# Patient Record
Sex: Female | Born: 1970
Health system: Southern US, Community
[De-identification: ages and names within clinical notes are randomized; demographics above are authoritative.]

## PROBLEM LIST (undated history)

## (undated) DIAGNOSIS — C50919 Malignant neoplasm of unspecified site of unspecified female breast: Secondary | ICD-10-CM

## (undated) DIAGNOSIS — D649 Anemia, unspecified: Secondary | ICD-10-CM

## (undated) DIAGNOSIS — R011 Cardiac murmur, unspecified: Secondary | ICD-10-CM

## (undated) DIAGNOSIS — E119 Type 2 diabetes mellitus without complications: Secondary | ICD-10-CM

## (undated) DIAGNOSIS — N6019 Diffuse cystic mastopathy of unspecified breast: Secondary | ICD-10-CM

## (undated) DIAGNOSIS — G4730 Sleep apnea, unspecified: Secondary | ICD-10-CM

## (undated) HISTORY — DX: Malignant neoplasm of unspecified site of unspecified female breast: C50.919

## (undated) HISTORY — DX: Anemia, unspecified: D64.9

## (undated) HISTORY — DX: Sleep apnea, unspecified: G47.30

## (undated) HISTORY — PX: TUBAL LIGATION: SHX77

## (undated) HISTORY — PX: KNEE SURGERY: SHX244

## (undated) HISTORY — DX: Diffuse cystic mastopathy of unspecified breast: N60.19

## (undated) HISTORY — PX: WRIST SURGERY: SHX841

---

## 1993-03-18 HISTORY — PX: TONSILECTOMY/ADENOIDECTOMY WITH MYRINGOTOMY: SHX6125

## 1999-03-19 HISTORY — PX: OTHER SURGICAL HISTORY: SHX169

## 2009-03-18 HISTORY — PX: BREAST LUMPECTOMY: SHX2

## 2009-03-18 HISTORY — PX: ROTATOR CUFF REPAIR: SHX139

## 2011-06-05 ENCOUNTER — Emergency Department (HOSPITAL_COMMUNITY)
Admission: EM | Admit: 2011-06-05 | Discharge: 2011-06-05 | Discharge status: Against Medical Advice | Payer: BC Managed Care – PPO | Attending: Emergency Medicine | Admitting: Emergency Medicine

## 2011-06-05 DIAGNOSIS — R51 Headache: Secondary | ICD-10-CM | POA: Insufficient documentation

## 2011-09-18 ENCOUNTER — Other Ambulatory Visit: Payer: Self-pay | Admitting: Family Medicine

## 2011-09-18 DIAGNOSIS — Z1231 Encounter for screening mammogram for malignant neoplasm of breast: Secondary | ICD-10-CM

## 2011-10-02 ENCOUNTER — Ambulatory Visit: Payer: BC Managed Care – PPO

## 2011-10-04 ENCOUNTER — Ambulatory Visit
Admission: RE | Admit: 2011-10-04 | Discharge: 2011-10-04 | Disposition: A | Discharge status: Home / Self Care | Payer: BC Managed Care – PPO | Source: Ambulatory Visit | Attending: Family Medicine | Admitting: Family Medicine

## 2011-10-04 DIAGNOSIS — Z1231 Encounter for screening mammogram for malignant neoplasm of breast: Secondary | ICD-10-CM

## 2012-04-29 ENCOUNTER — Encounter: Payer: Self-pay | Admitting: Obstetrics and Gynecology

## 2012-05-07 ENCOUNTER — Encounter: Payer: Self-pay | Admitting: Obstetrics and Gynecology

## 2012-05-07 ENCOUNTER — Ambulatory Visit: Payer: BC Managed Care – PPO | Admitting: Obstetrics and Gynecology

## 2012-05-07 VITALS — BP 120/70 | HR 72 | Resp 16 | Ht 67.0 in | Wt 218.0 lb

## 2012-05-07 DIAGNOSIS — Z803 Family history of malignant neoplasm of breast: Secondary | ICD-10-CM

## 2012-05-07 DIAGNOSIS — N920 Excessive and frequent menstruation with regular cycle: Secondary | ICD-10-CM | POA: Insufficient documentation

## 2012-05-07 DIAGNOSIS — Z124 Encounter for screening for malignant neoplasm of cervix: Secondary | ICD-10-CM

## 2012-05-07 DIAGNOSIS — D219 Benign neoplasm of connective and other soft tissue, unspecified: Secondary | ICD-10-CM

## 2012-05-07 DIAGNOSIS — Z139 Encounter for screening, unspecified: Secondary | ICD-10-CM

## 2012-05-07 LAB — POCT URINALYSIS DIPSTICK
Bilirubin, UA: NEGATIVE
Blood, UA: NEGATIVE
Glucose, UA: NEGATIVE
Leukocytes, UA: NEGATIVE
Nitrite, UA: NEGATIVE
Urobilinogen, UA: NEGATIVE

## 2012-05-07 LAB — CBC
Hemoglobin: 13.8 g/dL (ref 12.0–15.0)
MCH: 28.8 pg (ref 26.0–34.0)
MCHC: 33.4 g/dL (ref 30.0–36.0)
Platelets: 347 10*3/uL (ref 150–400)
RBC: 4.8 MIL/uL (ref 3.87–5.11)

## 2012-05-07 LAB — TSH: TSH: 1.255 u[IU]/mL (ref 0.350–4.500)

## 2012-05-07 LAB — PROLACTIN: Prolactin: 14.4 ng/mL

## 2012-05-07 NOTE — Progress Notes (Signed)
Contraception BTL Last pap 2012 WNL Last Mammo 12/2011 WNL Last Colonoscopy None Last Dexa Scan None Primary MD Terrance Johns Abuse at Home: None  C/o very heavy cycles lasting 10days.  Changes pad q 1hr some days.  Filed Vitals:   05/07/12 0943  BP: 120/70  Pulse: 72  Resp: 16   ROS: noncontributory  Physical Examination: General appearance - alert, well appearing, and in no distress Neck - supple, no significant adenopathy Chest - clear to auscultation, no wheezes, rales or rhonchi, symmetric air entry Heart - normal rate and regular rhythm Abdomen - soft, nontender, nondistended, no masses or organomegaly Breasts - breasts appear normal, no suspicious masses, no skin or nipple changes or axillary nodes Pelvic - normal external genitalia, vulva, vagina, cervix, uterus and adnexa Back exam - no CVAT Extremities - no edema, redness or tenderness in the calves or thighs  A/P H/o 2 sisters with breast ca - no h/o brca 1 and 2 testing (pt has 11 sisters) - dx mammo at breast ctr - pt used to see a breast specialist in [place] H/o fibroids asymptomatic and small per pt and now with menorrhagia - sched u/s and embx next available TSH, cbc, prl, vit D

## 2012-05-08 LAB — VITAMIN D 25 HYDROXY (VIT D DEFICIENCY, FRACTURES): Vit D, 25-Hydroxy: 26 ng/mL — ABNORMAL LOW (ref 30–89)

## 2012-05-11 ENCOUNTER — Telehealth: Payer: Self-pay

## 2012-05-11 LAB — PAP IG W/ RFLX HPV ASCU

## 2012-05-11 NOTE — Telephone Encounter (Signed)
Called the [facility], as I got a message from them stating that a Dx mammogram was ordered. Pt has strong family hx of breast cancer. 2 of her sisters have breast cancer. They will not do this, as as screening mammogram was done last year in July. They will not do more than one per yr with previously normal result. They do not want to over-radiate the breast. They did say that I should tell the pt to ask for the 3-D mammo option in July, as that will give her more peace of mind. I called the pt to tell her all of this and she said she is going to follow back up with her breast specialist in [place] to see what his/her recommendations are. Melody Comas A

## 2012-05-18 ENCOUNTER — Telehealth: Payer: Self-pay

## 2012-05-18 ENCOUNTER — Other Ambulatory Visit: Payer: Self-pay

## 2012-05-18 DIAGNOSIS — E559 Vitamin D deficiency, unspecified: Secondary | ICD-10-CM

## 2012-05-18 NOTE — Telephone Encounter (Signed)
Spoke to pt to let her know Vit D level is low. Vitamin D protocol done. Recall entered and future order done. Melody Comas A

## 2012-05-25 ENCOUNTER — Telehealth: Payer: Self-pay | Admitting: Obstetrics and Gynecology

## 2012-05-25 NOTE — Telephone Encounter (Signed)
Ar pt 

## 2012-05-25 NOTE — Telephone Encounter (Signed)
Recalled Vit D protocol to [facility] in [place] on [address]. It was called previously, but I guess message did not transmit. Melody Comas A

## 2012-06-01 ENCOUNTER — Telehealth: Payer: Self-pay | Admitting: Obstetrics and Gynecology

## 2012-06-01 NOTE — Telephone Encounter (Signed)
Spoke with pt rgd msg pt had questions rgd how rx for vitd dispensed advised pt to call insurance company pt voice understanding

## 2013-04-01 ENCOUNTER — Other Ambulatory Visit: Payer: Self-pay | Admitting: Obstetrics and Gynecology

## 2013-04-01 DIAGNOSIS — N63 Unspecified lump in unspecified breast: Secondary | ICD-10-CM

## 2013-04-12 ENCOUNTER — Ambulatory Visit
Admission: RE | Admit: 2013-04-12 | Discharge: 2013-04-12 | Disposition: A | Discharge status: Home / Self Care | Payer: BC Managed Care – PPO | Source: Ambulatory Visit | Attending: Obstetrics and Gynecology | Admitting: Obstetrics and Gynecology

## 2013-04-12 ENCOUNTER — Other Ambulatory Visit: Payer: Self-pay | Admitting: Obstetrics and Gynecology

## 2013-04-12 DIAGNOSIS — N63 Unspecified lump in unspecified breast: Secondary | ICD-10-CM

## 2013-12-19 DIAGNOSIS — Z803 Family history of malignant neoplasm of breast: Secondary | ICD-10-CM | POA: Insufficient documentation

## 2014-01-17 ENCOUNTER — Encounter: Payer: Self-pay | Admitting: Obstetrics and Gynecology

## 2015-09-28 DIAGNOSIS — Z8639 Personal history of other endocrine, nutritional and metabolic disease: Secondary | ICD-10-CM | POA: Insufficient documentation

## 2015-09-28 DIAGNOSIS — Z8669 Personal history of other diseases of the nervous system and sense organs: Secondary | ICD-10-CM | POA: Insufficient documentation

## 2015-09-28 DIAGNOSIS — N898 Other specified noninflammatory disorders of vagina: Secondary | ICD-10-CM | POA: Insufficient documentation

## 2015-09-28 DIAGNOSIS — Z862 Personal history of diseases of the blood and blood-forming organs and certain disorders involving the immune mechanism: Secondary | ICD-10-CM | POA: Insufficient documentation

## 2015-09-28 DIAGNOSIS — R6882 Decreased libido: Secondary | ICD-10-CM | POA: Insufficient documentation

## 2015-09-28 DIAGNOSIS — N6011 Diffuse cystic mastopathy of right breast: Secondary | ICD-10-CM | POA: Insufficient documentation

## 2015-09-28 DIAGNOSIS — Z8619 Personal history of other infectious and parasitic diseases: Secondary | ICD-10-CM | POA: Insufficient documentation

## 2015-12-19 ENCOUNTER — Other Ambulatory Visit: Payer: Self-pay | Admitting: Chiropractic Medicine

## 2015-12-19 ENCOUNTER — Ambulatory Visit
Admission: RE | Admit: 2015-12-19 | Discharge: 2015-12-19 | Disposition: A | Discharge status: Home / Self Care | Payer: BLUE CROSS/BLUE SHIELD | Source: Ambulatory Visit | Attending: Chiropractic Medicine | Admitting: Chiropractic Medicine

## 2015-12-19 DIAGNOSIS — M5408 Panniculitis affecting regions of neck and back, sacral and sacrococcygeal region: Secondary | ICD-10-CM

## 2015-12-19 DIAGNOSIS — M609 Myositis, unspecified: Secondary | ICD-10-CM

## 2016-03-22 ENCOUNTER — Other Ambulatory Visit: Payer: Self-pay

## 2016-03-22 ENCOUNTER — Other Ambulatory Visit: Payer: Self-pay | Admitting: Obstetrics and Gynecology

## 2016-03-22 DIAGNOSIS — R9389 Abnormal findings on diagnostic imaging of other specified body structures: Secondary | ICD-10-CM

## 2016-03-22 DIAGNOSIS — N63 Unspecified lump in unspecified breast: Secondary | ICD-10-CM

## 2016-03-25 ENCOUNTER — Other Ambulatory Visit: Payer: BLUE CROSS/BLUE SHIELD

## 2016-03-28 ENCOUNTER — Ambulatory Visit
Admission: RE | Admit: 2016-03-28 | Discharge: 2016-03-28 | Disposition: A | Discharge status: Home / Self Care | Payer: BLUE CROSS/BLUE SHIELD | Source: Ambulatory Visit | Attending: Obstetrics and Gynecology | Admitting: Obstetrics and Gynecology

## 2016-03-28 DIAGNOSIS — R9389 Abnormal findings on diagnostic imaging of other specified body structures: Secondary | ICD-10-CM

## 2016-03-28 DIAGNOSIS — N63 Unspecified lump in unspecified breast: Secondary | ICD-10-CM

## 2016-03-28 MED ORDER — GADOBENATE DIMEGLUMINE 529 MG/ML IV SOLN
20.0000 mL | Freq: Once | INTRAVENOUS | Status: AC | PRN
  Administered 2016-03-28: 20 mL via INTRAVENOUS

## 2016-04-17 DIAGNOSIS — R928 Other abnormal and inconclusive findings on diagnostic imaging of breast: Secondary | ICD-10-CM | POA: Insufficient documentation

## 2016-04-24 ENCOUNTER — Ambulatory Visit
Admission: RE | Admit: 2016-04-24 | Discharge: 2016-04-24 | Disposition: A | Discharge status: Home / Self Care | Payer: BLUE CROSS/BLUE SHIELD | Source: Ambulatory Visit | Attending: Obstetrics and Gynecology | Admitting: Obstetrics and Gynecology

## 2016-04-24 ENCOUNTER — Other Ambulatory Visit: Payer: Self-pay | Admitting: Obstetrics and Gynecology

## 2016-04-24 DIAGNOSIS — Z09 Encounter for follow-up examination after completed treatment for conditions other than malignant neoplasm: Secondary | ICD-10-CM

## 2016-05-19 DIAGNOSIS — Z1589 Genetic susceptibility to other disease: Secondary | ICD-10-CM | POA: Insufficient documentation

## 2017-11-12 DIAGNOSIS — Z800 Family history of malignant neoplasm of digestive organs: Secondary | ICD-10-CM | POA: Insufficient documentation

## 2017-12-14 ENCOUNTER — Encounter (HOSPITAL_BASED_OUTPATIENT_CLINIC_OR_DEPARTMENT_OTHER): Payer: Self-pay | Admitting: *Deleted

## 2017-12-14 ENCOUNTER — Emergency Department (HOSPITAL_BASED_OUTPATIENT_CLINIC_OR_DEPARTMENT_OTHER)
Admission: EM | Admit: 2017-12-14 | Discharge: 2017-12-15 | Disposition: A | Discharge status: Home / Self Care | Payer: BLUE CROSS/BLUE SHIELD | Attending: Emergency Medicine | Admitting: Emergency Medicine

## 2017-12-14 ENCOUNTER — Other Ambulatory Visit: Payer: Self-pay

## 2017-12-14 DIAGNOSIS — G43009 Migraine without aura, not intractable, without status migrainosus: Secondary | ICD-10-CM | POA: Diagnosis not present

## 2017-12-14 DIAGNOSIS — Z79899 Other long term (current) drug therapy: Secondary | ICD-10-CM | POA: Diagnosis not present

## 2017-12-14 DIAGNOSIS — R51 Headache: Secondary | ICD-10-CM | POA: Diagnosis present

## 2017-12-14 MED ORDER — DIPHENHYDRAMINE HCL 50 MG/ML IJ SOLN
25.0000 mg | Freq: Once | INTRAMUSCULAR | Status: AC
Start: 2017-12-14 — End: 2017-12-15
  Administered 2017-12-15: 25 mg via INTRAVENOUS
  Filled 2017-12-14: qty 1

## 2017-12-14 MED ORDER — PROCHLORPERAZINE EDISYLATE 10 MG/2ML IJ SOLN
10.0000 mg | Freq: Once | INTRAMUSCULAR | Status: AC
  Administered 2017-12-15: 10 mg via INTRAVENOUS
  Filled 2017-12-14: qty 2

## 2017-12-14 MED ORDER — KETOROLAC TROMETHAMINE 30 MG/ML IJ SOLN
30.0000 mg | Freq: Once | INTRAMUSCULAR | Status: AC
Start: 2017-12-14 — End: 2017-12-15
  Administered 2017-12-15: 30 mg via INTRAVENOUS
  Filled 2017-12-14: qty 1

## 2017-12-14 NOTE — ED Triage Notes (Signed)
Pt reports she works for [employer]. She began having headaches and saw a doctor there who dx her with cluster headaches vs tension headaches. She has been using the prescribed medications (methocarbamol, hydroxyzine, and eletriptan) without relief

## 2017-12-15 NOTE — Discharge Instructions (Addendum)
You were seen today for headache.  It is unclear whether this is recurrent migraine versus tension headaches.  You may need to see a neurologist as an outpatient if you continue to have headaches.  Make sure to stay well-hydrated and get good sleep.

## 2017-12-15 NOTE — ED Provider Notes (Signed)
[facility] EMERGENCY DEPART[facility] Provider Note   CSN: [id number] Arrival date & time: 12/14/17  2226     History   Chief Complaint Chief Complaint  Patient presents with  . Headache    HPI Michelle Moss is a 47 y.o. female.  HPI  This is a 47 year old female with a remote history of migraines who presents with headache.  Patient reports 2-week history of mostly nightly headaches.  She states that during the day she is usually comfortable but at night she can get a dull left-sided headache with intermittent sharp pains.  She denies any vision changes, photosensitivity, nausea, vomiting.  Pain radiates into her left neck and left arm.  She has not noted any rashes.  She saw her doctor at work who prescribed her with muscle relaxer, triptan, anti-inflammatory medication.  She states that these are not helping.  Tonight pain was more acute in onset and worse.  It is currently 8 out of 10.  She denies any fevers, neck stiffness.  She does have a remote history of migraines.  She denies any upper respiratory symptoms.  She did take 1 dose of Sudafed with no relief thinking it may be related to sinusitis.  Past Medical History:  Diagnosis Date  . Fibrocystic breast     Patient Active Problem List   Diagnosis Date Noted  . Menorrhagia 05/07/2012  . Fibroids 05/07/2012    Past Surgical History:  Procedure Laterality Date  . BREAST LUMPECTOMY  2011  . BTL  2001  . ROTATOR CUFF REPAIR  2011  . TONSILECTOMY/ADENOIDECTOMY WITH MYRINGOTOMY  1995  . TUBAL LIGATION    . WRIST SURGERY       OB History    Gravida  2   Para  2   Term      Preterm      AB      Living        SAB      TAB      Ectopic      Multiple      Live Births               Home Medications    Prior to Admission medications   Medication Sig Start Date End Date Taking? Authorizing Provider  eletriptan (RELPAX) 40 MG tablet Take 40 mg by mouth as needed for migraine or headache. May  repeat in 2 hours if headache persists or recurs.   Yes [provider]  HYDROXYZINE PAMOATE PO Take 50 mg by mouth as needed.   Yes [provider]  methocarbamol (ROBAXIN) 500 MG tablet Take 500 mg by mouth as needed for muscle spasms.   Yes [provider]  Omega-3 Fatty Acids (FISH OIL PO) Take by mouth.   Yes [provider]    Family History Family History  Problem Relation Age of Onset  . Diabetes Father   . Heart disease Father   . Arthritis Mother     Social History Social History   Tobacco Use  . Smoking status: Never Smoker  . Smokeless tobacco: Never Used  Substance Use Topics  . Alcohol use: Not Currently    Comment: social  . Drug use: No     Allergies   Patient has no known allergies.   Review of Systems Review of Systems  Constitutional: Negative for fever.  Eyes: Negative for photophobia.  Respiratory: Negative for shortness of breath.   Cardiovascular: Negative for chest pain.  Gastrointestinal: Negative  for nausea and vomiting.  Neurological: Positive for headaches. Negative for dizziness, speech difficulty, weakness and numbness.  All other systems reviewed and are negative.    Physical Exam Updated Vital Signs BP (!) 145/99 (BP Location: Left Arm)   Pulse 71   Temp 99 F (37.2 C) (Oral)   Resp 20   Ht 1.702 m (5\' 7" )   Wt 102.5 kg   SpO2 98%   BMI 35.40 kg/m   Physical Exam  Constitutional: She is oriented to person, place, and time. She appears well-developed and well-nourished. No distress.  HENT:  Head: Normocephalic and atraumatic.  Cerumen impaction  Eyes: Pupils are equal, round, and reactive to light. EOM are normal.  Neck: Normal range of motion. Neck supple.  No meningismus  Cardiovascular: Normal rate, regular rhythm and normal heart sounds.  Pulmonary/Chest: Effort normal. No respiratory distress. She has no wheezes.  Abdominal: Soft. Bowel sounds are normal.  Neurological: She is  alert and oriented to person, place, and time.  Fluent speech, cranial nerves II through XII intact, 5 out of 5 strength in all 4 extremities, no dysmetria to finger-nose-finger  Skin: Skin is warm and dry. No rash noted.  Psychiatric: She has a normal mood and affect.  Nursing note and vitals reviewed.    ED Treatments / Results  Labs (all labs ordered are listed, but only abnormal results are displayed) Labs Reviewed - No data to display  EKG None  Radiology No results found.  Procedures Procedures (including critical care time)  Medications Ordered in ED Medications  prochlorperazine (COMPAZINE) injection 10 mg (10 mg Intravenous Given 12/15/17 0016)  diphenhydrAMINE (BENADRYL) injection 25 mg (25 mg Intravenous Given 12/15/17 0007)  ketorolac (TORADOL) 30 MG/ML injection 30 mg (30 mg Intravenous Given 12/15/17 0012)     Initial Impression / Assessment and Plan / ED Course  I have reviewed the triage vital signs and the nursing notes.  Pertinent labs & imaging results that were available during my care of the patient were reviewed by me and considered in my medical decision making (see chart for details).     Patient presents with headache.  She is overall nontoxic-appearing vital signs are reassuring.  No red flags.  Doubt infectious etiology or subarachnoid hemorrhage.  She does have a history of migraines.  However, there are some symptoms to suggest tension headache.  Her history and exam are reassuring.  I do not feel imaging is indicated at this time.  I discussed this with the patient.  We will focus on getting her feeling better.  If she has continued recurrent headaches, she will need to follow-up with neurology as an outpatient.  Patient was given a migraine cocktail.  She had resolution of symptoms and states she is ready to go home.  Final Clinical Impressions(s) / ED Diagnoses   Final diagnoses:  Migraine without aura and without status migrainosus, not  intractable    ED Discharge Orders    None       Monterius Rolf, Barbette Hair, MD 12/15/17 [phone]

## 2018-02-05 ENCOUNTER — Ambulatory Visit: Payer: BLUE CROSS/BLUE SHIELD | Attending: Family Medicine | Admitting: Physical Therapy

## 2018-02-05 ENCOUNTER — Encounter: Payer: Self-pay | Admitting: Physical Therapy

## 2018-02-05 DIAGNOSIS — M25561 Pain in right knee: Secondary | ICD-10-CM | POA: Insufficient documentation

## 2018-02-05 NOTE — Therapy (Signed)
[facility] [address]  [place], [place], [address] Phone: [phone]   Fax:  [phone]  Physical Therapy Evaluation  Patient Details  Name: Michelle Moss MRN: [id number] Date of Birth: 05/31/1970 Referring Provider (PT): Bronson Curb MD   Encounter Date: 02/05/2018  PT End of Session - 02/05/18 1449    Visit Number  1    Number of Visits  12    Date for PT Re-Evaluation  03/19/18    Authorization Type  BCBS VL 60    PT Start Time  1315    PT Stop Time  1410    PT Time Calculation (min)  55 min    Activity Tolerance  Patient tolerated treatment well    Behavior During Therapy  [facility] for tasks assessed/performed       Past Medical History:  Diagnosis Date  . Fibrocystic breast     Past Surgical History:  Procedure Laterality Date  . BREAST LUMPECTOMY  2011  . BTL  2001  . ROTATOR CUFF REPAIR  2011  . TONSILECTOMY/ADENOIDECTOMY WITH MYRINGOTOMY  1995  . TUBAL LIGATION    . WRIST SURGERY      There were no vitals filed for this visit.   Subjective Assessment - 02/05/18 1318    Subjective  Pt relays 2-3 months ago started having some knee pain, she saw MD and prescribed some meds which helped some. She has been trying to run for exercise and training for 5K but her knee has been limiting her with this. She saw Ortho who took x-rays showing some mild OA and some patella femoral syndrome. She saws MD prescribed knee brace(she did not bring this but sounds like cophat strap) this has has helped some. She does have a catching sensation in her knee with walking at times, increased pain and difficulty with squats or lunges, stairs, wearing heels.     Pertinent History  no significant PMH for knee or co-morbidities, Rt RCR 2011.    Limitations  Sitting;Lifting;Standing;Walking;House hold activities    How long can you stand comfortably?  less than one hour    Diagnostic tests  x-rays show mild OA, and  patellafemoral syndrome    Currently in Pain?  Yes    Pain Score  7     Pain Location  Knee    Pain Orientation  Right    Pain Descriptors / Indicators  Aching;Sharp    Pain Type  Acute pain   subacute   Pain Radiating Towards  none    Pain Onset  More than a month ago    Pain Frequency  Intermittent    Pain Relieving Factors  ice, sometimes meds         [facility] PT Assessment - 02/05/18 0001      Assessment   Medical Diagnosis  Rt knee pain, patellofemoral syndrome and mild OA    Referring Provider (PT)  Bronson Curb MD    Onset Date/Surgical Date  --   3 month onset of pain   Next MD Visit  --   not scheduled   Prior Therapy  PT for back and shoulder in past      Precautions   Precautions  None      Restrictions   Weight Bearing Restrictions  No      Balance Screen   Has the patient fallen in the past 6 months  No      Home Environment  Living Environment  Private residence      Prior Function   Level of [place]   currently still independent   Vocation  Full time employment    Vocation Requirements  office work, sitting mostly    Leisure  exercise, training for [employer]      Observation/Other Assessments   Focus on Therapeutic Outcomes (FOTO)   57% limited      Sensation   Light Touch  Appears Intact      ROM / Strength   AROM / PROM / Strength  AROM;Strength      AROM   Overall AROM Comments  WFL knee ROM      Strength   Overall Strength Comments  Rt knee strength 5/5, Rt hip strength 5/5 except 4/5 adduction strength       Flexibility   Soft Tissue Assessment /Muscle Length  --   mild tightness in Rt IT band, H.S, quads     Palpation   Patella mobility  mild lateral tracking      Special Tests   Other special tests  neg ligamentous tests, +meniscal tests for pain but no catching or locking reported (mcmurrays,thessly, edges)      Ambulation/Gait   Gait Comments  WFL                Objective measurements completed on  examination: See above findings.      [facility] Adult PT Treatment/Exercise - 02/05/18 0001      Modalities   Modalities  Cryotherapy;Electrical Stimulation      Cryotherapy   Number Minutes Cryotherapy  10 Minutes    Cryotherapy Location  Knee    Type of Cryotherapy  Ice pack      Electrical Stimulation   Electrical Stimulation Location  Rt knee    Electrical Stimulation Action  IFC    Electrical Stimulation Parameters  tolerance    Electrical Stimulation Goals  Pain             PT Education - 02/05/18 1438    Education Details  HEP, POC, avoid anything at gym or running that causes more than 3/10 pain, focus on gentle eccentric strengthening and adduction strength, running mechanics of striking on mid foot and shorter strides with increased cadence    Person(s) Educated  Patient    Methods  Explanation;Demonstration;Verbal cues;Handout    Comprehension  Verbalized understanding;Need further instruction          PT Long Term Goals - 02/05/18 1456      PT LONG TERM GOAL #1   Title  Pt will be I and compliant with HEP. 6 weeks 03/19/17    Status  New      PT LONG TERM GOAL #2   Title  Pt will be able to perform her usual ADL's and gym workout routine and walk up/down one flight of stairs with less than 2/10 overall pain. 6 weeks 03/19/17    Status  New      PT LONG TERM GOAL #3   Title  Pt will be improve FOTO to less than 35% limitation. 6 weeks 03/19/17    Status  New      PT LONG TERM GOAL #4   Title  Pt will be able to return to jogging with less than 2/10 pain. 6 weeks 03/19/17    Status  New             Plan - 02/05/18 1451    Clinical Impression  Statement  Pt presents with Rt knee pain, patellofemoral syndrome and mild OA. She has some lateral patella tracking, increased tightness in IT band, quads and H.S on Rt, decreased hip add strength, increased pain with stairs, squatting, prolonged walking, and running. Meniscal pathology could not be ruled out today  and she also has signs of quad tendonitis. She will benefit from skilled PT to address her deficits.     Clinical Presentation  Evolving    Clinical Presentation due to:  worsening pain over last 3 months    Clinical Decision Making  Moderate    Rehab Potential  Good    Clinical Impairments Affecting Rehab Potential  pt performing high intensity exercise and running at gym, she was educted to back off of this while her knee heals    PT Frequency  2x / week    PT Duration  6 weeks    PT Treatment/Interventions  Cryotherapy;Electrical Stimulation;Iontophoresis 4mg /ml Dexamethasone;Moist Heat;Ultrasound;Stair training;Gait training;Therapeutic activities;Therapeutic exercise;Neuromuscular re-education;Manual techniques;Passive range of motion;Dry needling;Taping;Joint Manipulations    PT Next Visit Plan  review HEP, lateral tracking correction, eccentrics, consider taping and modalties       Patient will benefit from skilled therapeutic intervention in order to improve the following deficits and impairments:  Decreased activity tolerance, Decreased endurance, Decreased range of motion, Decreased strength, Hypomobility, Impaired flexibility, Pain, Postural dysfunction  Visit Diagnosis: Acute pain of right knee     Problem List Patient Active Problem List   Diagnosis Date Noted  . Menorrhagia 05/07/2012  . Fibroids 05/07/2012    Silvestre Mesi 02/05/2018, 3:11 PM  [facility] [address]  [place], [place], [address] Phone: [phone]   Fax:  [phone]  Name: Quynn Vilchis MRN: [id number] Date of Birth: 01/24/1971

## 2018-02-05 NOTE — Patient Instructions (Signed)
Access Code: [email or web address]  URL: [email or web address]  Date: 02/05/2018  Prepared by: Elsie Ra   Exercises  Seated Hamstring Stretch - 3 sets - 30 hold - 2x daily - 6x weekly  Supine ITB Stretch with Strap - 3 sets - 30 hold - 2x daily - 6x weekly  Forward Step Up - 10 reps - 3 sets - 5 hold - 1x daily - 7x weekly  Wall Squat with Ball between Knees - 10 reps - 3 sets - 2x daily - 6x weekly  Sit to Stand with Diona Foley Between Knees - 10 reps - 3 sets - 2x daily - 6x weekly  Sidelying Hip Adduction - 10 reps - 2-3 sets - 2x daily - 6x weekly

## 2018-02-11 ENCOUNTER — Encounter: Payer: Self-pay | Admitting: Physical Therapy

## 2018-02-11 ENCOUNTER — Ambulatory Visit: Payer: BLUE CROSS/BLUE SHIELD | Admitting: Physical Therapy

## 2018-02-11 DIAGNOSIS — M25561 Pain in right knee: Secondary | ICD-10-CM

## 2018-02-11 NOTE — Therapy (Addendum)
[facility] [address]  [place], [place], [address] Phone: [phone]   Fax:  [phone]  Physical Therapy Treatment  Patient Details  Name: Michelle Moss MRN: [id number] Date of Birth: Dec 14, 1970 Referring Provider (PT): Bronson Curb MD   Encounter Date: 02/11/2018  PT End of Session - 02/11/18 1145    Visit Number  2    Number of Visits  12    Date for PT Re-Evaluation  03/19/18    Authorization Type  BCBS VL 60    PT Start Time  3151    PT Stop Time  1243    PT Time Calculation (min)  58 min    Activity Tolerance  Patient tolerated treatment well    Behavior During Therapy  [facility] for tasks assessed/performed       Past Medical History:  Diagnosis Date  . Fibrocystic breast     Past Surgical History:  Procedure Laterality Date  . BREAST LUMPECTOMY  2011  . BTL  2001  . ROTATOR CUFF REPAIR  2011  . TONSILECTOMY/ADENOIDECTOMY WITH MYRINGOTOMY  1995  . TUBAL LIGATION    . WRIST SURGERY      There were no vitals filed for this visit.  Subjective Assessment - 02/11/18 1149    Subjective  Pt requesting help with ensuring proper use of knee Cho-pat brace/strap as well as clarification of HEP.    Pertinent History  no significant PMH for knee or co-morbidities, Rt RCR 2011.    Limitations  Sitting;Lifting;Standing;Walking;House hold activities    How long can you stand comfortably?  less than one hour    Diagnostic tests  x-rays show mild OA, and patellafemoral syndrome    Currently in Pain?  Yes    Pain Score  6    5-6/10   Pain Location  Knee    Pain Orientation  Right    Pain Descriptors / Indicators  Tightness   "swollen"   Pain Type  Acute pain   subacute   Pain Frequency  Constant   noting pain even at rest today                      [facility] Adult PT Treatment/Exercise - 02/11/18 1145      Exercises   Exercises  Knee/Hip      Knee/Hip Exercises: Stretches   Active  Hamstring Stretch  Right;30 seconds;2 reps    Active Hamstring Stretch Limitations  seated with hip hinge    ITB Stretch  Right;30 seconds;2 reps    ITB Stretch Limitations  supine with strap      Knee/Hip Exercises: Aerobic   Recumbent Bike  L2 x 6 min      Knee/Hip Exercises: Standing   Forward Step Up  Right;20 reps;Step Height: 6";Hand Hold: 0    Forward Step Up Limitations  cues for quad activation for full knee extension     Wall Squat  10 reps;3 seconds;2 sets    Wall Squat Limitations  + ball squeeze (2nd set with blue weighted med ball)      Knee/Hip Exercises: Supine   Bridges  Both;15 reps;Strengthening   5" hold   Bridges Limitations  + hip ABD isometric with green TB      Knee/Hip Exercises: Sidelying   Hip ADduction  Right;AROM;15 reps    Clams  R clam with green TB x15      Modalities   Modalities  Vasopneumatic  Vasopneumatic   Number Minutes Vasopneumatic   10 minutes    Vasopnuematic Location   Knee   Rt   Vasopneumatic Pressure  High    Vasopneumatic Temperature   coldest      Manual Therapy   Manual Therapy  Taping    Kinesiotex  Create Space      Kinesiotix   Create Space  R knee - chondromalacia patella pattern                  PT Long Term Goals - 02/11/18 1153      PT LONG TERM GOAL #1   Title  Pt will be I and compliant with HEP. 6 weeks 03/19/17    Status  On-going      PT LONG TERM GOAL #2   Title  Pt will be able to perform her usual ADL's and gym workout routine and walk up/down one flight of stairs with less than 2/10 overall pain. 6 weeks 03/19/17    Status  On-going      PT LONG TERM GOAL #3   Title  Pt will be improve FOTO to less than 35% limitation. 6 weeks 03/19/17    Status  On-going      PT LONG TERM GOAL #4   Title  Pt will be able to return to jogging with less than 2/10 pain. 6 weeks 03/19/17    Status  On-going            Plan - 02/11/18 1153    Clinical Impression Statement  Clarified rationale for  ITB stretch and verified proper placement of Cho-pat strap for use when running at pt request. Reviewed HEP clarifying proper pacing and hold times for stretches and exercises as well as cues to only take wall squats to depth of onset of pain and avoid pushing past pain. Initiated trial of kinesiotaping for R knee patellofemoral pain and will assess response on next visit. Treatment concluded with vasopneumatic compression due to c/o increased pain and swelling since running last night.     Rehab Potential  Good    Clinical Impairments Affecting Rehab Potential  pt performing high intensity exercise and running at gym, she was educted to back off of this while her knee heals    PT Frequency  2x / week    PT Duration  6 weeks    PT Treatment/Interventions  Cryotherapy;Electrical Stimulation;Iontophoresis 4mg /ml Dexamethasone;Moist Heat;Ultrasound;Stair training;Gait training;Therapeutic activities;Therapeutic exercise;Neuromuscular re-education;Manual techniques;Passive range of motion;Dry needling;Taping;Joint Manipulations    PT Next Visit Plan  assess response to taping, lateral tracking correction, eccentrics, proximal stability, consider further taping and modalties as benefit noted    Consulted and Agree with Plan of Care  Patient       Patient will benefit from skilled therapeutic intervention in order to improve the following deficits and impairments:  Decreased activity tolerance, Decreased endurance, Decreased range of motion, Decreased strength, Hypomobility, Impaired flexibility, Pain, Postural dysfunction  Visit Diagnosis: Acute pain of right knee     Problem List Patient Active Problem List   Diagnosis Date Noted  . Menorrhagia 05/07/2012  . Fibroids 05/07/2012    Percival Spanish, PT, MPT 02/11/2018, 1:49 PM  [facility] [address]  [place], [place], [address] Phone: [phone]   Fax:  [phone]  Name:  Michelle Moss MRN: [id number] Date of Birth: Aug 05, 1970

## 2018-02-17 ENCOUNTER — Encounter: Payer: BLUE CROSS/BLUE SHIELD | Admitting: Physical Therapy

## 2018-02-18 ENCOUNTER — Ambulatory Visit: Payer: BLUE CROSS/BLUE SHIELD | Attending: Family Medicine | Admitting: Physical Therapy

## 2018-02-18 DIAGNOSIS — M25561 Pain in right knee: Secondary | ICD-10-CM | POA: Diagnosis not present

## 2018-02-18 NOTE — Therapy (Signed)
[facility] [address]  [place], [place], [address] Phone: [phone]   Fax:  [phone]  Physical Therapy Treatment  Patient Details  Name: Michelle Moss MRN: [id number] Date of Birth: [birth year] Referring Provider (PT): Bronson Curb MD   Encounter Date: 02/18/2018  PT End of Session - 02/18/18 1615    Visit Number  3    Number of Visits  12    Date for PT Re-Evaluation  03/19/18    Authorization Type  BCBS VL 60    PT Start Time  1530    PT Stop Time  1623    PT Time Calculation (min)  53 min    Activity Tolerance  Patient tolerated treatment well    Behavior During Therapy  [facility] for tasks assessed/performed       Past Medical History:  Diagnosis Date  . Fibrocystic breast     Past Surgical History:  Procedure Laterality Date  . BREAST LUMPECTOMY  2011  . BTL  2001  . ROTATOR CUFF REPAIR  2011  . TONSILECTOMY/ADENOIDECTOMY WITH MYRINGOTOMY  1995  . TUBAL LIGATION    . WRIST SURGERY      There were no vitals filed for this visit.  Subjective Assessment - 02/18/18 1544    Subjective  Pt relays the tape really helped her knee.    How long can you stand comfortably?  less than one hour    Diagnostic tests  x-rays show mild OA, and patellafemoral syndrome    Currently in Pain?  Yes    Pain Score  4     Pain Location  Knee    Pain Orientation  Right    Pain Descriptors / Indicators  Tightness    Pain Type  Acute pain                       [facility] Adult PT Treatment/Exercise - 02/18/18 0001      Exercises   Exercises  Knee/Hip      Knee/Hip Exercises: Stretches   Quad Stretch  Right;3 reps;30 seconds    Quad Stretch Limitations  prone with strap    ITB Stretch  Right;30 seconds;2 reps    ITB Stretch Limitations  supine with strap      Knee/Hip Exercises: Aerobic   Tread Mill  intervals, 1st 3 min walking, then one min jog 5.0, then walk 1 min then jog 30 sec then walk one  min, 7 min total      Knee/Hip Exercises: Standing   Forward Lunges Limitations  10 reps bilat with TRX support focus on slow eccentric control.       Knee/Hip Exercises: Seated   Long Arc Quad  Right;2 sets;10 reps    Long Arc Quad Weight  3 lbs.    Long [employer] Limitations  ball sq    Sit to [employer]  15 reps   eccentric with ball sq from low mat and airex     Knee/Hip Exercises: Supine   Bridges  Both;10 reps    Bridges Limitations  + ball squeeze and alt knee ext      Knee/Hip Exercises: Sidelying   Hip ADduction  AROM;Right;2 sets;10 reps    Clams  Rt clam with green 2X10      Modalities   Modalities  Vasopneumatic      Vasopneumatic   Number Minutes Vasopneumatic   10 minutes    Vasopnuematic Location  Knee    Vasopneumatic Pressure  High    Vasopneumatic Temperature   coldest      Manual Therapy   Manual Therapy  Taping      [employer]   Create Space  R knee - chondromalacia patella pattern                  PT Long Term Goals - 02/11/18 1153      PT LONG TERM GOAL #1   Title  Pt will be I and compliant with HEP. 6 weeks 03/19/17    Status  On-going      PT LONG TERM GOAL #2   Title  Pt will be able to perform her usual ADL's and gym workout routine and walk up/down one flight of stairs with less than 2/10 overall pain. 6 weeks 03/19/17    Status  On-going      PT LONG TERM GOAL #3   Title  Pt will be improve FOTO to less than 35% limitation. 6 weeks 03/19/17    Status  On-going      PT LONG TERM GOAL #4   Title  Pt will be able to return to jogging with less than 2/10 pain. 6 weeks 03/19/17    Status  On-going            Plan - 02/18/18 1617    Clinical Impression Statement  Pt progressed to walk/jog interval on treadmill today and she did not have pain but had tightness in her knee. Quad stretching performed and then focus on eccentric strenthenig for quad and hip adduction strength. Pt had good toelrance to session and recieved Vaso for  soreness and KT tape for chrondromalacia as she relays this helped last visit.     PT Frequency  2x / week    PT Duration  6 weeks    PT Treatment/Interventions  Cryotherapy;Electrical Stimulation;Iontophoresis 4mg /ml Dexamethasone;Moist Heat;Ultrasound;Stair training;Gait training;Therapeutic activities;Therapeutic exercise;Neuromuscular re-education;Manual techniques;Passive range of motion;Dry needling;Taping;Joint Manipulations    PT Next Visit Plan  assess response to taping, lateral tracking correction, eccentrics, proximal stability, consider further taping and modalties as benefit noted    Consulted and Agree with Plan of Care  Patient       Patient will benefit from skilled therapeutic intervention in order to improve the following deficits and impairments:  Decreased activity tolerance, Decreased endurance, Decreased range of motion, Decreased strength, Hypomobility, Impaired flexibility, Pain, Postural dysfunction  Visit Diagnosis: Acute pain of right knee     Problem List Patient Active Problem List   Diagnosis Date Noted  . Menorrhagia 05/07/2012  . Fibroids 05/07/2012    Michelle Moss 02/18/2018, 4:24 PM  [facility] [address]  [place], [place], [address] Phone: [phone]   Fax:  [phone]  Name: Michelle Moss MRN: [id number] Date of Birth: Mar 01, 1971

## 2018-02-20 ENCOUNTER — Ambulatory Visit: Payer: BLUE CROSS/BLUE SHIELD

## 2018-02-20 DIAGNOSIS — M25561 Pain in right knee: Secondary | ICD-10-CM | POA: Diagnosis not present

## 2018-02-20 NOTE — Therapy (Signed)
[facility] [address]  [place], [place], [address] Phone: [phone]   Fax:  [phone]  Physical Therapy Treatment  Patient Details  Name: Michelle Moss MRN: [id number] Date of Birth: 06-29-1970 Referring Provider (PT): Bronson Curb MD   Encounter Date: 02/20/2018  PT End of Session - 02/20/18 0854    Visit Number  4    Number of Visits  12    Date for PT Re-Evaluation  03/19/18    Authorization Type  BCBS VL 60    PT Start Time  0850    PT Stop Time  0935    PT Time Calculation (min)  45 min    Activity Tolerance  Patient tolerated treatment well    Behavior During Therapy  [facility] for tasks assessed/performed       Past Medical History:  Diagnosis Date  . Fibrocystic breast     Past Surgical History:  Procedure Laterality Date  . BREAST LUMPECTOMY  2011  . BTL  2001  . ROTATOR CUFF REPAIR  2011  . TONSILECTOMY/ADENOIDECTOMY WITH MYRINGOTOMY  1995  . TUBAL LIGATION    . WRIST SURGERY      There were no vitals filed for this visit.  Subjective Assessment - 02/20/18 0853    Subjective  pt. reporting she is attending 5x/week at local gym.      Pertinent History  no significant PMH for knee or co-morbidities, Rt RCR 2011.    Diagnostic tests  x-rays show mild OA, and patellafemoral syndrome    Currently in Pain?  No/denies    Multiple Pain Sites  No                       [facility] Adult PT Treatment/Exercise - 02/20/18 0857      Knee/Hip Exercises: Stretches   ITB Stretch  Right;30 seconds;2 reps      Knee/Hip Exercises: Aerobic   Recumbent Bike  L2 x 7 min      Knee/Hip Exercises: Standing   Terminal Knee Extension  Right;10 reps;Strengthening;Theraband    Terminal Knee Extension Limitations  TKE with blue TB closed in door     Forward Step Up  Right;15 reps;Step Height: 8";Hand Hold: 0    Step Down  Right;10 reps;Step Height: 4";Hand Hold: 0;Hand Hold: 1   heel tap to side of  4" step at machine    Step Down Limitations  intermittent 1 UE support at machine       Knee/Hip Exercises: Supine   Bridges  Both;15 reps    Bridges Limitations  + ball squeeze     Straight Leg Raises  Right;15 reps      Knee/Hip Exercises: Sidelying   Hip ABduction  Right;15 reps    Hip ABduction Limitations  Cues to avoid hip flexion              PT Education - 02/20/18 0944    Education Details  HEP update; blue TB issued to pt.     Person(s) Educated  Patient    Methods  Demonstration;Explanation          PT Long Term Goals - 02/11/18 1153      PT LONG TERM GOAL #1   Title  Pt will be I and compliant with HEP. 6 weeks 03/19/17    Status  On-going      PT LONG TERM GOAL #2   Title  Pt will be able  to perform her usual ADL's and gym workout routine and walk up/down one flight of stairs with less than 2/10 overall pain. 6 weeks 03/19/17    Status  On-going      PT LONG TERM GOAL #3   Title  Pt will be improve FOTO to less than 35% limitation. 6 weeks 03/19/17    Status  On-going      PT LONG TERM GOAL #4   Title  Pt will be able to return to jogging with less than 2/10 pain. 6 weeks 03/19/17    Status  On-going            Plan - 02/20/18 0855    Clinical Impression Statement  Doing well today and notes she just came from gym this morning.  Notes benefit from taping two visits ago however unsure if last visit's taping to R knee helped.  Tolerated all TKE/quad and proximal hip strengthening activities well today with addition of 4" heel tap step-down.  Required min cueing with prone quad stretch for positioning however able to demo good overall technique with supine ITB stretch with strap.  HEP updated.  Will continue to progress toward goals.      Clinical Impairments Affecting Rehab Potential  pt performing high intensity exercise and running at gym, she was educted to back off of this while her knee heals    PT Frequency  2x / week    PT Duration  6 weeks    PT  Treatment/Interventions  Cryotherapy;Electrical Stimulation;Iontophoresis 4mg /ml Dexamethasone;Moist Heat;Ultrasound;Stair training;Gait training;Therapeutic activities;Therapeutic exercise;Neuromuscular re-education;Manual techniques;Passive range of motion;Dry needling;Taping;Joint Manipulations    PT Next Visit Plan  Lateral tracking correction, eccentrics, proximal stability, modalties as benefit noted    Consulted and Agree with Plan of Care  Patient       Patient will benefit from skilled therapeutic intervention in order to improve the following deficits and impairments:  Decreased activity tolerance, Decreased endurance, Decreased range of motion, Decreased strength, Hypomobility, Impaired flexibility, Pain, Postural dysfunction  Visit Diagnosis: Acute pain of right knee     Problem List Patient Active Problem List   Diagnosis Date Noted  . Menorrhagia 05/07/2012  . Fibroids 05/07/2012    Bess Harvest, PTA 02/20/18 9:59 AM   [facility] [address]  [place], [place], [address] Phone: [phone]   Fax:  [phone]  Name: Michelle Moss MRN: [id number] Date of Birth: 1970/09/14

## 2018-02-24 ENCOUNTER — Ambulatory Visit: Payer: BLUE CROSS/BLUE SHIELD | Admitting: Physical Therapy

## 2018-02-24 DIAGNOSIS — M25561 Pain in right knee: Secondary | ICD-10-CM

## 2018-02-24 NOTE — Therapy (Signed)
[facility] [address]  [place], [place], [address] Phone: [phone]   Fax:  [phone]  Physical Therapy Treatment  Patient Details  Name: Michelle Moss MRN: [id number] Date of Birth: January 31, 1971 Referring Provider (PT): Bronson Curb MD   Encounter Date: 02/24/2018  PT End of Session - 02/24/18 1718    Visit Number  5    Number of Visits  12    Date for PT Re-Evaluation  03/19/18    Authorization Type  BCBS VL 60    PT Start Time  0420    PT Stop Time  0503    PT Time Calculation (min)  43 min    Activity Tolerance  Patient tolerated treatment well       Past Medical History:  Diagnosis Date  . Fibrocystic breast     Past Surgical History:  Procedure Laterality Date  . BREAST LUMPECTOMY  2011  . BTL  2001  . ROTATOR CUFF REPAIR  2011  . TONSILECTOMY/ADENOIDECTOMY WITH MYRINGOTOMY  1995  . TUBAL LIGATION    . WRIST SURGERY      There were no vitals filed for this visit.  Subjective Assessment - 02/24/18 1629    Subjective  Pt reports she did not do her exercises over the weekend and she has been on her feet a lot today so more knee pain    Currently in Pain?  Yes    Pain Score  5     Pain Location  Knee                       [facility] Adult PT Treatment/Exercise - 02/24/18 0001      Exercises   Exercises  Knee/Hip      Knee/Hip Exercises: Stretches   Passive Hamstring Stretch  Both;2 reps;30 seconds    Passive Hamstring Stretch Limitations  supine with strap    Quad Stretch  Both;2 reps;30 seconds    Quad Stretch Limitations  prone with strap, pillow under knee    ITB Stretch  Both;2 reps;30 seconds    ITB Stretch Limitations  supine with strap      Knee/Hip Exercises: Aerobic   Elliptical  no resistance 3 min fwd/2 min backwards      Knee/Hip Exercises: Standing   Lateral Step Up  15 reps;Step Height: 8";Hand Hold: 1    Lateral Step Up Limitations  side to side step up  and over    Forward Step Up  Right;15 reps;Step Height: 8";Hand Hold: 0    Forward Step Up Limitations  slow lower back down      Knee/Hip Exercises: Seated   Sit to Sand  15 reps   Rt leg from raised ht of table     Knee/Hip Exercises: Supine   Bridges  15 reps    Bridges Limitations  + ball squeeze, with alt knee extension     Straight Leg Raises  Right;2 sets;10 reps      Knee/Hip Exercises: Sidelying   Hip ABduction  Right;2 sets;10 reps    Hip ADduction  Right;2 sets;10 reps      Manual Therapy   Manual therapy comments  STM,IASTM to Rt quads and tendon                  PT Long Term Goals - 02/11/18 1153      PT LONG TERM GOAL #1   Title  Pt will be  I and compliant with HEP. 6 weeks 03/19/17    Status  On-going      PT LONG TERM GOAL #2   Title  Pt will be able to perform her usual ADL's and gym workout routine and walk up/down one flight of stairs with less than 2/10 overall pain. 6 weeks 03/19/17    Status  On-going      PT LONG TERM GOAL #3   Title  Pt will be improve FOTO to less than 35% limitation. 6 weeks 03/19/17    Status  On-going      PT LONG TERM GOAL #4   Title  Pt will be able to return to jogging with less than 2/10 pain. 6 weeks 03/19/17    Status  On-going            Plan - 02/24/18 1718    Clinical Impression Statement  Session focused on stretching and LE strengthening to toleance with trial of MT for STM/IASTM to quads and tendon in efforts to reduce pain and tightness. Pt able to perform therex with good form and without complaints. PT will continue to progress as able    Rehab Potential  Good    Clinical Impairments Affecting Rehab Potential  pt performing high intensity exercise and running at gym, she was educted to back off of this while her knee heals    PT Frequency  2x / week    PT Duration  6 weeks    PT Treatment/Interventions  Cryotherapy;Electrical Stimulation;Iontophoresis 4mg /ml Dexamethasone;Moist Heat;Ultrasound;Stair  training;Gait training;Therapeutic activities;Therapeutic exercise;Neuromuscular re-education;Manual techniques;Passive range of motion;Dry needling;Taping;Joint Manipulations    PT Next Visit Plan  Assess response to MT last visit, Lateral tracking correction, eccentrics, proximal stability, modalties as benefit noted    Consulted and Agree with Plan of Care  Patient       Patient will benefit from skilled therapeutic intervention in order to improve the following deficits and impairments:  Decreased activity tolerance, Decreased endurance, Decreased range of motion, Decreased strength, Hypomobility, Impaired flexibility, Pain, Postural dysfunction  Visit Diagnosis: Acute pain of right knee     Problem List Patient Active Problem List   Diagnosis Date Noted  . Menorrhagia 05/07/2012  . Fibroids 05/07/2012    Debbe Odea, PT,DPT 02/24/2018, 5:21 PM  [facility] [address]  [place], [place], [address] Phone: [phone]   Fax:  [phone]  Name: Michelle Moss MRN: [id number] Date of Birth: May 17, 1970

## 2018-03-03 ENCOUNTER — Ambulatory Visit: Payer: BLUE CROSS/BLUE SHIELD | Admitting: Physical Therapy

## 2018-03-03 DIAGNOSIS — M25561 Pain in right knee: Secondary | ICD-10-CM | POA: Diagnosis not present

## 2018-03-03 NOTE — Therapy (Signed)
[facility] [address]  [place], [place], [address] Phone: [phone]   Fax:  [phone]  Physical Therapy Treatment/Discharge  Patient Details  Name: Michelle Moss MRN: [id number] Date of Birth: 07/09/1970 Referring Provider (PT): Bronson Curb MD   Encounter Date: 03/03/2018  PT End of Session - 03/03/18 1920    Visit Number  6    Number of Visits  12    Date for PT Re-Evaluation  03/19/18    Authorization Type  BCBS VL 60    PT Start Time  0500    PT Stop Time  0545    PT Time Calculation (min)  45 min    Activity Tolerance  Patient tolerated treatment well    Behavior During Therapy  [facility] for tasks assessed/performed       Past Medical History:  Diagnosis Date  . Fibrocystic breast     Past Surgical History:  Procedure Laterality Date  . BREAST LUMPECTOMY  2011  . BTL  2001  . ROTATOR CUFF REPAIR  2011  . TONSILECTOMY/ADENOIDECTOMY WITH MYRINGOTOMY  1995  . TUBAL LIGATION    . WRIST SURGERY      There were no vitals filed for this visit.  Subjective Assessment - 03/03/18 1911    Subjective  Pt relays little improvement since starting PT and she can not afford to keep coming to PT and wishes to discharge.     Diagnostic tests  x-rays show mild OA, and patellafemoral syndrome    Currently in Pain?  Yes    Pain Score  4     Pain Location  Knee    Pain Orientation  Right    Pain Descriptors / Indicators  Tightness    Pain Type  Acute pain    Pain Onset  More than a month ago    Pain Frequency  Constant                       [facility] Adult PT Treatment/Exercise - 03/03/18 0001      Knee/Hip Exercises: Stretches   ITB Stretch  Left;1 rep;30 seconds    ITB Stretch Limitations  standing      Knee/Hip Exercises: Aerobic   Recumbent Bike  L4 X 8 min      Modalities   Modalities  Ultrasound      Ultrasound   Ultrasound Location  Rt knee 9 min anterior, 5 min posterior    Ultrasound Parameters  anterior 3.8mz, 50%, 1.0w/cm2. Posterior 1 mhz, 100%, 1.0 w/cm2    Ultrasound Goals  Pain             PT Education - 03/03/18 1918    Education Details  HEP review and revision. Pt issued new copy, POC to DC back to MD due to lack of progress, edu to be cautious about over activity at gym    Person(s) Educated  Patient    Methods  Explanation    Comprehension  Verbalized understanding          PT Long Term Goals - 03/03/18 1925      PT LONG TERM GOAL #1   Title  Pt will be I and compliant with HEP. 6 weeks 03/19/17    Status  Partially Met      PT LONG TERM GOAL #2   Title  Pt will be able to perform her usual ADL's and gym workout routine and walk up/down one  flight of stairs with less than 2/10 overall pain. 6 weeks 03/19/17    Status  Not Met      PT LONG TERM GOAL #3   Title  Pt will be improve FOTO to less than 35% limitation. 6 weeks 03/19/17    Status  Not Met      PT LONG TERM GOAL #4   Title  Pt will be able to return to jogging with less than 2/10 pain. 6 weeks 03/19/17    Status  Not Met            Plan - 03/03/18 1921    Clinical Impression Statement  Pt relays minimal progress since beginning PT. U.S trialed today which reduced some pain and tighntess but she relays she can not continue to afford comming to PT. 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 Session today mostly focused on updating and revising HEP and she was recommended to return to MD for other recommendations for her knee pain.    PT Next Visit Plan  Discharge    Consulted and Agree with Plan of Care  Patient       Patient will benefit from skilled therapeutic intervention in order to improve the following deficits and impairments:     Visit Diagnosis: Acute pain of right knee     Problem List Patient Active Problem List   Diagnosis Date Noted  . Menorrhagia 05/07/2012  .  Fibroids 05/07/2012    Silvestre Mesi 03/03/2018, 7:26 PM  PHYSICAL THERAPY DISCHARGE SUMMARY  Visits from Start of Care: 6  Current functional level related to goals / functional outcomes: See above   Remaining deficits: Pain with jogging, squatting, lunges, lifting, walking up hill   Education / Equipment: Revised HEP Plan: Patient agrees to discharge.  Patient goals were met. Patient is being discharged due to financial reasons.  ?????    Elsie Ra, PT, DPT 03/03/18 7:39 PM  [facility] [address]  [place], [place], [address] Phone: [phone]   Fax:  [phone]  Name: Michelle Moss MRN: [id number] Date of Birth: Apr 26, 1970

## 2018-03-03 NOTE — Patient Instructions (Signed)
Access Code: [email or web address]  URL: [email or web address]  Date: 03/03/2018  Prepared by: Elsie Ra   Exercises  Supine Hamstring Stretch with Strap - 3 reps - 1 sets - 30 hold - 2x daily - 6x weekly  Supine ITB Stretch with Strap - 3 reps - 1 sets - 30 hold - 2x daily - 6x weekly  Prone Quadriceps Stretch with Strap - 3 reps - 1 sets - 30 hold - 2x daily - 6x weekly  Wall Squat with Ball between Knees - 10 reps - 1 sets - 5 hold - 2x daily - 6x weekly  Sit to Stand with Ball Between Knees - 10 reps - 1 sets - 5 hold - 2x daily - 6x weekly  Standing Terminal Knee Extension with Resistance - 10 reps - 2-3 sets - 5 hold - 2x daily - 6x weekly  Sidelying Hip Extension in Abduction - 10 reps - 3 sets - 2x daily - 6x weekly  Sidelying Hip Adduction - 10 reps - 3 sets - 2x daily - 6x weekly

## 2018-04-29 DIAGNOSIS — M25561 Pain in right knee: Secondary | ICD-10-CM | POA: Diagnosis not present

## 2018-04-30 DIAGNOSIS — M25561 Pain in right knee: Secondary | ICD-10-CM | POA: Diagnosis not present

## 2018-05-19 DIAGNOSIS — M25561 Pain in right knee: Secondary | ICD-10-CM | POA: Diagnosis not present

## 2018-05-19 DIAGNOSIS — M67261 Synovial hypertrophy, not elsewhere classified, right lower leg: Secondary | ICD-10-CM | POA: Diagnosis not present

## 2018-05-22 DIAGNOSIS — M25561 Pain in right knee: Secondary | ICD-10-CM | POA: Diagnosis not present

## 2018-11-06 ENCOUNTER — Ambulatory Visit
Admission: RE | Admit: 2018-11-06 | Discharge: 2018-11-06 | Disposition: A | Discharge status: Home / Self Care | Payer: BC Managed Care – PPO | Source: Ambulatory Visit | Attending: Family Medicine | Admitting: Family Medicine

## 2018-11-06 ENCOUNTER — Other Ambulatory Visit: Payer: Self-pay | Admitting: Family Medicine

## 2018-11-06 DIAGNOSIS — R52 Pain, unspecified: Secondary | ICD-10-CM

## 2018-11-06 DIAGNOSIS — M25561 Pain in right knee: Secondary | ICD-10-CM | POA: Diagnosis not present

## 2018-12-18 ENCOUNTER — Other Ambulatory Visit: Payer: Self-pay

## 2018-12-18 DIAGNOSIS — Z20822 Contact with and (suspected) exposure to covid-19: Secondary | ICD-10-CM

## 2018-12-20 LAB — NOVEL CORONAVIRUS, NAA: SARS-CoV-2, NAA: NOT DETECTED

## 2019-01-19 DIAGNOSIS — Z13820 Encounter for screening for osteoporosis: Secondary | ICD-10-CM | POA: Diagnosis not present

## 2019-01-19 DIAGNOSIS — Z1231 Encounter for screening mammogram for malignant neoplasm of breast: Secondary | ICD-10-CM | POA: Diagnosis not present

## 2019-01-27 DIAGNOSIS — R21 Rash and other nonspecific skin eruption: Secondary | ICD-10-CM | POA: Diagnosis not present

## 2019-01-27 DIAGNOSIS — L309 Dermatitis, unspecified: Secondary | ICD-10-CM | POA: Diagnosis not present

## 2019-01-27 DIAGNOSIS — D8989 Other specified disorders involving the immune mechanism, not elsewhere classified: Secondary | ICD-10-CM | POA: Diagnosis not present

## 2019-01-28 ENCOUNTER — Other Ambulatory Visit: Payer: Self-pay | Admitting: Family Medicine

## 2019-01-28 DIAGNOSIS — R609 Edema, unspecified: Secondary | ICD-10-CM

## 2019-01-28 DIAGNOSIS — R52 Pain, unspecified: Secondary | ICD-10-CM

## 2019-02-15 ENCOUNTER — Other Ambulatory Visit: Payer: Self-pay

## 2019-02-17 ENCOUNTER — Ambulatory Visit
Admission: RE | Admit: 2019-02-17 | Discharge: 2019-02-17 | Disposition: A | Discharge status: Home / Self Care | Payer: BLUE CROSS/BLUE SHIELD | Source: Ambulatory Visit | Attending: Family Medicine | Admitting: Family Medicine

## 2019-02-17 ENCOUNTER — Other Ambulatory Visit: Payer: Self-pay

## 2019-02-17 DIAGNOSIS — R609 Edema, unspecified: Secondary | ICD-10-CM

## 2019-02-17 DIAGNOSIS — R52 Pain, unspecified: Secondary | ICD-10-CM

## 2019-02-17 DIAGNOSIS — M25561 Pain in right knee: Secondary | ICD-10-CM | POA: Diagnosis not present

## 2019-04-02 ENCOUNTER — Encounter: Payer: Self-pay | Admitting: Physical Therapy

## 2019-04-02 ENCOUNTER — Ambulatory Visit: Payer: BC Managed Care – PPO | Attending: Family Medicine | Admitting: Physical Therapy

## 2019-04-02 ENCOUNTER — Other Ambulatory Visit: Payer: Self-pay

## 2019-04-02 DIAGNOSIS — M25561 Pain in right knee: Secondary | ICD-10-CM

## 2019-04-02 DIAGNOSIS — M25661 Stiffness of right knee, not elsewhere classified: Secondary | ICD-10-CM | POA: Insufficient documentation

## 2019-04-02 NOTE — Therapy (Signed)
[place], [place], [address] Phone: [phone]   Fax:  [phone]  Physical Therapy Evaluation  Patient Details  Name: Michelle Moss MRN: [id number] Date of Birth: 05-02-1970 Referring Provider (PT): Ralph Dowdy   Encounter Date: 04/02/2019  PT End of Session - 04/02/19 0919    Visit Number  1    Date for PT Re-Evaluation  05/31/19    PT Start Time  0845    PT Stop Time  0933    PT Time Calculation (min)  48 min    Activity Tolerance  Patient tolerated treatment well       Past Medical History:  Diagnosis Date  . Fibrocystic breast     Past Surgical History:  Procedure Laterality Date  . BREAST LUMPECTOMY  2011  . BTL  2001  . ROTATOR CUFF REPAIR  2011  . TONSILECTOMY/ADENOIDECTOMY WITH MYRINGOTOMY  1995  . TUBAL LIGATION    . WRIST SURGERY      There were no vitals filed for this visit.   Subjective Assessment - 04/02/19 0848    Subjective  Patient comes in with order for right knee pain with meniscus tear.  She is unsure of any specific cause of the injury.  She had PT in the past for patellar tracking issues, reports that this past October she had progressive knee pain and issues.  Had an injection last week with a little help    Limitations  Lifting;Standing;Walking    Patient Stated Goals  have less pain, walk better, be able to squat    Currently in Pain?  Yes    Pain Score  2     Pain Location  Knee    Pain Orientation  Right    Pain Descriptors / Indicators  Aching    Pain Type  Acute pain    Pain Onset  More than a month ago    Pain Frequency  Constant    Aggravating Factors   pain up to 8/10, at times just really hurts, walking on inclines increases pain    Pain Relieving Factors  Pain can be 2/10 with rest, ice, brace    Effect of Pain on Daily Activities  limits walking, squatting, sometimes just really hurts and I cannot do much         [facility] PT Assessment -  04/02/19 0001      Assessment   Medical Diagnosis  right meniscus tear    Referring Provider (PT)  Ralph Dowdy    Onset Date/Surgical Date  03/02/19    Prior Therapy  about 2 years ago      Precautions   Precautions  None      Balance Screen   Has the patient fallen in the past 6 months  No    Has the patient had a decrease in activity level because of a fear of falling?   No    Is the patient reluctant to leave their home because of a fear of falling?   No      Home Environment   Additional Comments  some stairs, does housework, .25 miles ot the mail box      Prior Function   Level of Independence  Independent    Vocation  Full time employment    Vocation Requirements  mostly sitting    Leisure  2 mile a day walk      ROM / Strength   AROM /  PROM / Strength  AROM;Strength;PROM      AROM   Overall AROM Comments  pain with flexion    AROM Assessment Site  Knee    Right/Left Knee  Right    Right Knee Extension  10    Right Knee Flexion  104      PROM   Overall PROM Comments  WNL's with some pain      Strength   Overall Strength Comments  MMT of the right knee incresaed pain in the anterior and posterior knee    Strength Assessment Site  Knee    Right/Left Knee  Right    Right Knee Flexion  4-/5    Right Knee Extension  4-/5      Flexibility   Soft Tissue Assessment /Muscle Length  yes    Hamstrings  tight    Quadriceps  tight    ITB  tight    Piriformis  tight      Palpation   Palpation comment  non tender , mild warmth over patellar tendon area, has lateral tracking patella      Ambulation/Gait   Gait Comments  normal gait , she does report limping at times                Objective measurements completed on examination: See above findings.                PT Short Term Goals - 04/02/19 0949      PT SHORT TERM GOAL #1   Title  indepednent with initial HEP    Time  2    Period  Weeks    Status  New        PT Long Term Goals -  04/02/19 1021      PT LONG TERM GOAL #1   Title  Pt will be I and compliant with HEP.    Time  8    Period  Weeks    Status  New      PT LONG TERM GOAL #2   Title  Pt will be able to perform her usual ADL's and gym workout routine and walk up/down one flight of stairs with less than 2/10 overall pain.    Time  8    Period  Weeks    Status  New      PT LONG TERM GOAL #3   Title  increase AROM to WFL's    Time  8    Period  Weeks    Status  New      PT LONG TERM GOAL #4   Title  increase strength to 4+/5 without pain    Time  8    Period  Weeks    Status  New             Plan - 04/02/19 0950    Clinical Impression Statement  [name] reports that she has had some right knee pain for a few years, no specific cause of injury, reports that she had PT that helped a little about two years ago, then reports that in October she started having increased pain and it has not gotten better, had MRI that showed torn meniscus.  She is very tight in the LE's, has some limited ROM of the right knee.  She has the most difficulty walking on inclines and squatting    Stability/Clinical Decision Making  Stable/Uncomplicated    Clinical Decision Making  Low    Rehab Potential  Good    PT Frequency  2x / week    PT Duration  8 weeks    PT Treatment/Interventions  ADLs/Self Care Home Management;Electrical Stimulation;Iontophoresis 4mg /ml Dexamethasone;Cryotherapy;Ultrasound;Functional mobility training;Stair training;Therapeutic activities;Therapeutic exercise;Balance training;Neuromuscular re-education;Manual techniques;Patient/family education    PT Next Visit Plan  patient has a high deductible may limit visits       Patient will benefit from skilled therapeutic intervention in order to improve the following deficits and impairments:  Abnormal gait, Pain, Decreased mobility, Decreased strength, Decreased range of motion, Difficulty walking, Impaired flexibility  Visit Diagnosis: Acute  pain of right knee  Stiffness of right knee, not elsewhere classified     Problem List Patient Active Problem List   Diagnosis Date Noted  . Menorrhagia 05/07/2012  . Fibroids 05/07/2012    Sumner Boast., PT 04/02/2019, 12:07 PM  [place], [place], [address] Phone: [phone]   Fax:  [phone]  Name: Michelle Gerdeman MRN: [id number] Date of Birth: 10/29/1970

## 2019-04-02 NOTE — Patient Instructions (Signed)
Access Code: [email or web address]  URL: [email or web address]  Date: 04/02/2019  Prepared by: Lum Babe   Exercises Supine Piriformis Stretch Pulling Heel to Hip - 5 reps - 1 sets - 30 hold - 2x daily - 7x weekly Hooklying Hamstring Stretch with Strap - 5 reps - 1 sets - 30 hold - 2x daily - 7x weekly Supine ITB Stretch with Strap - 5 reps - 1 sets - 30 hold - 2x daily - 7x weekly Prone Quadriceps Stretch with Strap - 5 reps - 1 sets - 30 hold - 2x daily - 7x weekly Supine Short Arc Quad - 10 reps - 2 sets - 3 hold - 2x daily - 7x weekly

## 2019-04-06 ENCOUNTER — Ambulatory Visit: Payer: BC Managed Care – PPO | Admitting: Physical Therapy

## 2019-04-09 ENCOUNTER — Encounter: Payer: Self-pay | Admitting: Physical Therapy

## 2019-04-09 ENCOUNTER — Ambulatory Visit: Payer: BC Managed Care – PPO | Admitting: Physical Therapy

## 2019-04-09 ENCOUNTER — Other Ambulatory Visit: Payer: Self-pay

## 2019-04-09 DIAGNOSIS — M25561 Pain in right knee: Secondary | ICD-10-CM

## 2019-04-09 DIAGNOSIS — M25661 Stiffness of right knee, not elsewhere classified: Secondary | ICD-10-CM | POA: Diagnosis not present

## 2019-04-09 NOTE — Therapy (Signed)
[place], [place], [address] Phone: [phone]   Fax:  [phone]  Physical Therapy Treatment  Patient Details  Name: Michelle Moss MRN: [id number] Date of Birth: Aug 19, 1970 Referring Provider (PT): Ralph Dowdy   Encounter Date: 04/09/2019  PT End of Session - 04/09/19 0916    Visit Number  2    Date for PT Re-Evaluation  05/31/19    PT Start Time  0758    PT Stop Time  0845    PT Time Calculation (min)  47 min    Activity Tolerance  Patient tolerated treatment well       Past Medical History:  Diagnosis Date  . Fibrocystic breast     Past Surgical History:  Procedure Laterality Date  . BREAST LUMPECTOMY  2011  . BTL  2001  . ROTATOR CUFF REPAIR  2011  . TONSILECTOMY/ADENOIDECTOMY WITH MYRINGOTOMY  1995  . TUBAL LIGATION    . WRIST SURGERY      There were no vitals filed for this visit.  Subjective Assessment - 04/09/19 0802    Subjective  I am okay.  I feel like I am going slow and I alter a lot of things    Currently in Pain?  Yes    Pain Score  2     Pain Location  Knee    Pain Orientation  Right    Aggravating Factors   sitting cross legged                       [facility] Adult PT Treatment/Exercise - 04/09/19 0001      Exercises   Exercises  Knee/Hip      Knee/Hip Exercises: Stretches   Passive Hamstring Stretch  Both;4 reps;20 seconds    Piriformis Stretch  Both;4 reps;20 seconds      Knee/Hip Exercises: Aerobic   Recumbent Bike  L2 x 5 minutes    Nustep  Level 5 x 6 minutes      Knee/Hip Exercises: Machines for Strengthening   Cybex Knee Extension  10# 2x10    Cybex Knee Flexion  25# 2x10    Cybex Leg Press  20# x10 with both legs, then 20# single legs had to stol single leg on the right this caused some pain even with small ROM      Knee/Hip Exercises: Standing   Walking with Sports Cord  all directions in the hall               PT  Short Term Goals - 04/09/19 0918      PT SHORT TERM GOAL #1   Title  indepednent with initial HEP    Status  Achieved        PT Long Term Goals - 04/02/19 1021      PT LONG TERM GOAL #1   Title  Pt will be I and compliant with HEP.    Time  8    Period  Weeks    Status  New      PT LONG TERM GOAL #2   Title  Pt will be able to perform her usual ADL's and gym workout routine and walk up/down one flight of stairs with less than 2/10 overall pain.    Time  8    Period  Weeks    Status  New      PT LONG TERM GOAL #3   Title  increase  AROM to WFL's    Time  8    Period  Weeks    Status  New      PT LONG TERM GOAL #4   Title  increase strength to 4+/5 without pain    Time  8    Period  Weeks    Status  New            Plan - 04/09/19 0917    Clinical Impression Statement  Patient reports that she is doing okay and trying to get more active.  She had pain with single leg leg press even with decreasing the ROM, she was fatigued with the resisted gait    PT Next Visit Plan  slowly progress again has a high deductible so we are limiting her visits    Consulted and Agree with Plan of Care  Patient       Patient will benefit from skilled therapeutic intervention in order to improve the following deficits and impairments:  Abnormal gait, Pain, Decreased mobility, Decreased strength, Decreased range of motion, Difficulty walking, Impaired flexibility  Visit Diagnosis: Acute pain of right knee  Stiffness of right knee, not elsewhere classified     Problem List Patient Active Problem List   Diagnosis Date Noted  . Menorrhagia 05/07/2012  . Fibroids 05/07/2012    Sumner Boast., PT 04/09/2019, 9:19 AM  [place], [place], [address] Phone: [phone]   Fax:  [phone]  Name: Michelle Moss MRN: [id number] Date of Birth: 1970/09/24

## 2019-04-12 DIAGNOSIS — L239 Allergic contact dermatitis, unspecified cause: Secondary | ICD-10-CM | POA: Diagnosis not present

## 2019-04-16 ENCOUNTER — Encounter: Payer: Self-pay | Admitting: Physical Therapy

## 2019-04-16 ENCOUNTER — Other Ambulatory Visit: Payer: Self-pay

## 2019-04-16 ENCOUNTER — Ambulatory Visit: Payer: BC Managed Care – PPO | Admitting: Physical Therapy

## 2019-04-16 DIAGNOSIS — M25661 Stiffness of right knee, not elsewhere classified: Secondary | ICD-10-CM | POA: Diagnosis not present

## 2019-04-16 DIAGNOSIS — M25561 Pain in right knee: Secondary | ICD-10-CM

## 2019-04-16 NOTE — Therapy (Signed)
[place], [place], [address] Phone: [phone]   Fax:  [phone]  Physical Therapy Treatment  Patient Details  Name: Michelle Moss MRN: [id number] Date of Birth: 1971-02-01 Referring Provider (PT): Ralph Dowdy   Encounter Date: 04/16/2019  PT End of Session - 04/16/19 0840    Visit Number  3    Date for PT Re-Evaluation  05/31/19    PT Start Time  0800    PT Stop Time  0841    PT Time Calculation (min)  41 min       Past Medical History:  Diagnosis Date  . Fibrocystic breast     Past Surgical History:  Procedure Laterality Date  . BREAST LUMPECTOMY  2011  . BTL  2001  . ROTATOR CUFF REPAIR  2011  . TONSILECTOMY/ADENOIDECTOMY WITH MYRINGOTOMY  1995  . TUBAL LIGATION    . WRIST SURGERY      There were no vitals filed for this visit.  Subjective Assessment - 04/16/19 0800    Subjective  Pt reports that she has not been doing her HEP so no real update on anything    Currently in Pain?  No/denies                       [facility] Adult PT Treatment/Exercise - 04/16/19 0001      Knee/Hip Exercises: Stretches   Passive Hamstring Stretch  Both;4 reps;20 seconds      Knee/Hip Exercises: Aerobic   Recumbent Bike  L2 x 4 minutes    Nustep  Level 5 x 6 minutes      Knee/Hip Exercises: Machines for Strengthening   Cybex Knee Extension  10# 2x15    Cybex Knee Flexion  25# 2x15    Cybex Leg Press  20lb 2x15      Knee/Hip Exercises: Standing   Heel Raises  Both;2 sets;15 reps;2 seconds    Lateral Step Up  Right;Left;1 set;10 reps;Hand Hold: 0;Step Height: 6"    Forward Step Up  Both;1 set;10 reps;Hand Hold: 0;Step Height: 6"    Walking with Sports Cord  40lb 4 way x5 each               PT Short Term Goals - 04/09/19 [id number]      PT SHORT TERM GOAL #1   Title  indepednent with initial HEP    Status  Achieved        PT Long Term Goals - 04/02/19 1021      PT LONG TERM GOAL #1   Title  Pt will be I and compliant with HEP.    Time  8    Period  Weeks    Status  New      PT LONG TERM GOAL #2   Title  Pt will be able to perform her usual ADL's and gym workout routine and walk up/down one flight of stairs with less than 2/10 overall pain.    Time  8    Period  Weeks    Status  New      PT LONG TERM GOAL #3   Title  increase AROM to WFL's    Time  8    Period  Weeks    Status  New      PT LONG TERM GOAL #4   Title  increase strength to 4+/5 without pain    Time  8  Period  Weeks    Status  New            Plan - 04/16/19 0841    Clinical Impression Statement  Pt did well with a progressed treatment session.Avoided any single leg strengthening due to pain from last session. She did however report some R knee discomfort with step up interventions. Good control with resisted gait.    Stability/Clinical Decision Making  Stable/Uncomplicated    Rehab Potential  Good    PT Frequency  2x / week    PT Duration  8 weeks    PT Treatment/Interventions  ADLs/Self Care Home Management;Electrical Stimulation;Iontophoresis 4mg /ml Dexamethasone;Cryotherapy;Ultrasound;Functional mobility training;Stair training;Therapeutic activities;Therapeutic exercise;Balance training;Neuromuscular re-education;Manual techniques;Patient/family education    PT Next Visit Plan  slowly progress again has a high deductible so we are limiting her visits       Patient will benefit from skilled therapeutic intervention in order to improve the following deficits and impairments:  Abnormal gait, Pain, Decreased mobility, Decreased strength, Decreased range of motion, Difficulty walking, Impaired flexibility  Visit Diagnosis: Acute pain of right knee  Stiffness of right knee, not elsewhere classified     Problem List Patient Active Problem List   Diagnosis Date Noted  . Menorrhagia 05/07/2012  . Fibroids 05/07/2012    Scot Jun, PTA 04/16/2019,  8:42 AM  [place], [place], [address] Phone: [phone]   Fax:  [phone]  Name: Michelle Moss MRN: [id number] Date of Birth: [birth year]

## 2019-04-23 ENCOUNTER — Ambulatory Visit: Payer: BC Managed Care – PPO | Attending: Family Medicine | Admitting: Physical Therapy

## 2019-04-23 ENCOUNTER — Other Ambulatory Visit: Payer: Self-pay

## 2019-04-23 ENCOUNTER — Encounter: Payer: Self-pay | Admitting: Physical Therapy

## 2019-04-23 DIAGNOSIS — M25661 Stiffness of right knee, not elsewhere classified: Secondary | ICD-10-CM | POA: Diagnosis not present

## 2019-04-23 DIAGNOSIS — M25561 Pain in right knee: Secondary | ICD-10-CM

## 2019-04-23 NOTE — Therapy (Signed)
[place], [place], [address] Phone: [phone]   Fax:  [phone]  Physical Therapy Treatment  Patient Details  Name: Michelle Moss MRN: [id number] Date of Birth: 11-07-1970 Referring Provider (PT): Ralph Dowdy   Encounter Date: 04/23/2019  PT End of Session - 04/23/19 0838    Visit Number  4    Date for PT Re-Evaluation  05/31/19    PT Start Time  0757    PT Stop Time  0838    PT Time Calculation (min)  41 min    Activity Tolerance  Patient tolerated treatment well    Behavior During Therapy  [facility] for tasks assessed/performed       Past Medical History:  Diagnosis Date  . Fibrocystic breast     Past Surgical History:  Procedure Laterality Date  . BREAST LUMPECTOMY  2011  . BTL  2001  . ROTATOR CUFF REPAIR  2011  . TONSILECTOMY/ADENOIDECTOMY WITH MYRINGOTOMY  1995  . TUBAL LIGATION    . WRIST SURGERY      There were no vitals filed for this visit.  Subjective Assessment - 04/23/19 0807    Subjective  "I got a little knee in my pain this week, around Wednesday when I got off the treadmill"    Currently in Pain?  Yes    Pain Score  4     Pain Location  Knee    Pain Orientation  Right                       [facility] Adult PT Treatment/Exercise - 04/23/19 0001      High Level Balance   High Level Balance Comments  SL bal toss 2x10 RLE       Knee/Hip Exercises: Aerobic   Elliptical  I10 R6 x 4 min     Recumbent Bike  L2 x 6 minutes      Knee/Hip Exercises: Machines for Strengthening   Cybex Knee Extension  15lb 2x10    Cybex Knee Flexion  35lb 2x10    Cybex Leg Press  50lb 3x10       Knee/Hip Exercises: Standing   Heel Raises  Both;2 sets;15 reps;2 seconds    Lateral Step Up  Both;1 set;10 reps;Hand Hold: 0;Step Height: 6"    Walking with Sports Cord  50lb side step 5 each     Other Standing Knee Exercises  SL DL to 4 in box 3lb 2x10                 PT Short Term Goals - 04/09/19 [id number]      PT SHORT TERM GOAL #1   Title  indepednent with initial HEP    Status  Achieved        PT Long Term Goals - 04/23/19 0839      PT LONG TERM GOAL #1   Title  Pt will be I and compliant with HEP.    Status  Achieved      PT LONG TERM GOAL #2   Title  Pt will be able to perform her usual ADL's and gym workout routine and walk up/down one flight of stairs with less than 2/10 overall pain.    Status  On-going      PT LONG TERM GOAL #3   Title  increase AROM to WFL's    Status  On-going      PT LONG TERM GOAL #  4   Title  increase strength to 4+/5 without pain    Status  On-going            Plan - 04/23/19 0839    Clinical Impression Statement  Pt well overall with today's session. She does report tolerable pain with lateral step ups and resisted sides step. Increase resistance tolerated wish all machine level interventions. Good strength and ROM with all exercises despite some pain.    Stability/Clinical Decision Making  Stable/Uncomplicated    Rehab Potential  Good    PT Frequency  2x / week    PT Duration  8 weeks    PT Treatment/Interventions  ADLs/Self Care Home Management;Electrical Stimulation;Iontophoresis 4mg /ml Dexamethasone;Cryotherapy;Ultrasound;Functional mobility training;Stair training;Therapeutic activities;Therapeutic exercise;Balance training;Neuromuscular re-education;Manual techniques;Patient/family education    PT Next Visit Plan  slowly progress again has a high deductible so we are limiting her visits       Patient will benefit from skilled therapeutic intervention in order to improve the following deficits and impairments:  Abnormal gait, Pain, Decreased mobility, Decreased strength, Decreased range of motion, Difficulty walking, Impaired flexibility  Visit Diagnosis: Acute pain of right knee  Stiffness of right knee, not elsewhere classified     Problem List Patient Active Problem  List   Diagnosis Date Noted  . Menorrhagia 05/07/2012  . Fibroids 05/07/2012    Scot Jun, PTA 04/23/2019, 8:42 AM  [place], [place], [address] Phone: [phone]   Fax:  [phone]  Name: Michelle Moss MRN: [id number] Date of Birth: 1970/08/30

## 2019-04-30 ENCOUNTER — Other Ambulatory Visit: Payer: Self-pay

## 2019-04-30 ENCOUNTER — Encounter: Payer: Self-pay | Admitting: Physical Therapy

## 2019-04-30 ENCOUNTER — Ambulatory Visit: Payer: BC Managed Care – PPO | Admitting: Physical Therapy

## 2019-04-30 DIAGNOSIS — M25661 Stiffness of right knee, not elsewhere classified: Secondary | ICD-10-CM | POA: Diagnosis not present

## 2019-04-30 DIAGNOSIS — M25561 Pain in right knee: Secondary | ICD-10-CM

## 2019-04-30 NOTE — Therapy (Signed)
[place], [place], [address] Phone: [phone]   Fax:  [phone]  Physical Therapy Treatment  Patient Details  Name: Michelle Moss MRN: [id number] Date of Birth: 04/07/1970 Referring Provider (PT): Ralph Dowdy   Encounter Date: 04/30/2019  PT End of Session - 04/30/19 0838    Visit Number  5    Date for PT Re-Evaluation  05/31/19    PT Start Time  0756    PT Stop Time  0838    PT Time Calculation (min)  42 min    Activity Tolerance  Patient tolerated treatment well    Behavior During Therapy  [facility] for tasks assessed/performed       Past Medical History:  Diagnosis Date  . Fibrocystic breast     Past Surgical History:  Procedure Laterality Date  . BREAST LUMPECTOMY  2011  . BTL  2001  . ROTATOR CUFF REPAIR  2011  . TONSILECTOMY/ADENOIDECTOMY WITH MYRINGOTOMY  1995  . TUBAL LIGATION    . WRIST SURGERY      There were no vitals filed for this visit.  Subjective Assessment - 04/30/19 0800    Subjective  Pt reports that the injection has wore off and now she feels everything    Currently in Pain?  Yes    Pain Score  5     Pain Location  Knee    Pain Orientation  Right         [facility] PT Assessment - 04/30/19 0001      AROM   Right Knee Extension  3    Right Knee Flexion  105      Strength   Right Knee Flexion  4+/5    Right Knee Extension  5/5                   [facility] Adult PT Treatment/Exercise - 04/30/19 0001      High Level Balance   High Level Balance Comments  SL RLE rebound ball toss on airex 4 way x 10        Knee/Hip Exercises: Aerobic   Elliptical  I11 R7 x 4 min     Recumbent Bike  L3 x 4 minutes      Knee/Hip Exercises: Machines for Strengthening   Cybex Knee Extension  20lb 3x10     Cybex Knee Flexion  45lb 2x10, LLE 25lb 2x10     Cybex Leg Press  60lb 3x10       Knee/Hip Exercises: Standing   Heel Raises  Both;2 sets;15 reps;2 seconds     Walking with Sports Cord  40lb side step over 2 foam rolls x5 each       Knee/Hip Exercises: Seated   Sit to Sand  2 sets;10 reps;without UE support   RLE elevated UBE seat              PT Short Term Goals - 04/09/19 8032      PT SHORT TERM GOAL #1   Title  indepednent with initial HEP    Status  Achieved        PT Long Term Goals - 04/30/19 0839      PT LONG TERM GOAL #2   Title  Pt will be able to perform her usual ADL's and gym workout routine and walk up/down one flight of stairs with less than 2/10 overall pain.    Status  On-going  PT LONG TERM GOAL #3   Title  increase AROM to WFL's    Status  Partially Met      PT LONG TERM GOAL #4   Title  increase strength to 4+/5 without pain    Status  Achieved            Plan - 04/30/19 0839    Clinical Impression Statement  Pt has progressed increasing her R knee AROM, She has also increased her R quad ans HS strength. Some goals met. She still has some R HS weakness and she lacks a few degrees of extension. Increase resistance tolerated on leg press and seated leg curls without issue. Side step over foal rolls against resistance was taxing.    Stability/Clinical Decision Making  Stable/Uncomplicated    Rehab Potential  Good    PT Frequency  2x / week    PT Duration  8 weeks    PT Treatment/Interventions  ADLs/Self Care Home Management;Electrical Stimulation;Iontophoresis 32m/ml Dexamethasone;Cryotherapy;Ultrasound;Functional mobility training;Stair training;Therapeutic activities;Therapeutic exercise;Balance training;Neuromuscular re-education;Manual techniques;Patient/family education    PT Next Visit Plan  slowly progress again has a high deductible so we are limiting her visits       Patient will benefit from skilled therapeutic intervention in order to improve the following deficits and impairments:  Abnormal gait, Pain, Decreased mobility, Decreased strength, Decreased range of motion, Difficulty  walking, Impaired flexibility  Visit Diagnosis: Stiffness of right knee, not elsewhere classified  Acute pain of right knee     Problem List Patient Active Problem List   Diagnosis Date Noted  . Menorrhagia 05/07/2012  . Fibroids 05/07/2012    RScot Jun PTA 04/30/2019, 8:41 AM  [place] [address]Phone: [phone]  Fax:  [phone] Name: Michelle OdleMRN: [id number] of Birth: 7September 06, 1972

## 2019-05-11 DIAGNOSIS — L309 Dermatitis, unspecified: Secondary | ICD-10-CM | POA: Diagnosis not present

## 2019-05-14 ENCOUNTER — Encounter: Payer: BC Managed Care – PPO | Admitting: Physical Therapy

## 2019-05-28 ENCOUNTER — Encounter: Payer: BC Managed Care – PPO | Admitting: Physical Therapy

## 2019-09-17 ENCOUNTER — Other Ambulatory Visit: Payer: Self-pay | Admitting: Internal Medicine

## 2019-09-17 ENCOUNTER — Ambulatory Visit
Admission: RE | Admit: 2019-09-17 | Discharge: 2019-09-17 | Disposition: A | Discharge status: Home / Self Care | Payer: BC Managed Care – PPO | Source: Ambulatory Visit | Attending: Internal Medicine | Admitting: Internal Medicine

## 2019-09-17 DIAGNOSIS — R609 Edema, unspecified: Secondary | ICD-10-CM

## 2019-09-17 DIAGNOSIS — S62512A Displaced fracture of proximal phalanx of left thumb, initial encounter for closed fracture: Secondary | ICD-10-CM | POA: Diagnosis not present

## 2019-09-17 DIAGNOSIS — T1490XA Injury, unspecified, initial encounter: Secondary | ICD-10-CM

## 2019-09-29 DIAGNOSIS — S62514A Nondisplaced fracture of proximal phalanx of right thumb, initial encounter for closed fracture: Secondary | ICD-10-CM | POA: Diagnosis not present

## 2019-11-24 ENCOUNTER — Encounter: Payer: Self-pay | Admitting: Family Medicine

## 2019-11-24 ENCOUNTER — Other Ambulatory Visit: Payer: Self-pay

## 2019-11-24 ENCOUNTER — Ambulatory Visit: Payer: BC Managed Care – PPO | Admitting: Family Medicine

## 2019-11-24 ENCOUNTER — Ambulatory Visit (INDEPENDENT_AMBULATORY_CARE_PROVIDER_SITE_OTHER): Payer: BC Managed Care – PPO

## 2019-11-24 ENCOUNTER — Ambulatory Visit: Payer: Self-pay

## 2019-11-24 VITALS — BP 130/90 | HR 71 | Ht 67.0 in | Wt 246.6 lb

## 2019-11-24 DIAGNOSIS — M79645 Pain in left finger(s): Secondary | ICD-10-CM

## 2019-11-24 NOTE — Progress Notes (Signed)
X-ray thumb shows there still is a persistent fracture line.  This means that he probably had a fibrous union and not a full bony healing like we discussed.  Regardless this should still be able to do well with some conservative management.  Plan for trial of physical therapy and if not better will proceed with MRI.

## 2019-11-24 NOTE — Patient Instructions (Signed)
Thank you for coming in today. Plan for hand PT.  Use voltaren gel on the thumb up to 4x daily.  Use the splint as needed for heavy duty lifting activity.  Keep me updated. If not going well let me know.   Recheck in about 6 weeks if not better.  Could just do the MRI first.

## 2019-11-24 NOTE — Progress Notes (Signed)
Subjective:    CC: L hand/thumb pain  I, Molly Weber, LAT, ATC, am serving as scribe for Dr. Lynne Leader.  HPI: Pt is a 49 y/o female presenting w/ c/o L thumb/hand pain after she injured it while playing VB.  She fell and ended up fracturing the base of her L thumb .  She locates her pain to her L base of the thumb and IP joint.  Fracture occurred in early July.  She was referred to Dr. Lenon Curt for management of her fracture.  She had 8 weeks of immobilization and per report pretty good fracture healing.  She notes continued stiffness in the MCP and IP joint.  She has not had hand therapy yet.  She notes her right hand had a gamekeeper's thumb injury in the past requiring surgery.  She cannot member that was.  She works for [employer].  L thumb swelling: No Aggravating factors: thumb pinch grip as in holding a plate; L thumb DIP flexion;  Treatments tried: removable brace x 8 weeks; Voltaren gel  Diagnostic testing: L hand XR- 09/17/19  Pertinent review of Systems: No fevers or chills  Relevant historical information: Pretty healthy.  History of fibroids   Objective:    Vitals:   11/24/19 0826  BP: 130/90  Pulse: 71  SpO2: 96%   General: Well Developed, well nourished, and in no acute distress.   MSK: Left thumb normal-appearing Normal motion.  On the tender palpation MCP and IP. No laxity. Intact strength. Cap refill and sensation are intact distally.  Lab and Radiology Results  X-ray images left thumb obtained today personally and independently reviewed. Healed avulsion fracture at proximal phalanx compared to prior imaging in July 2. Await formal radiology review  Diagnostic Limited MSK Ultrasound of: Left thumb Intact appearing ulnar collateral ligament at MCP. No significant laxity with UCL stress test dynamically on ultrasound imaging. Small calcific fragments within ulnar joint line MCP. Flexor tendon intact no severe triggering present on  ultrasound Impression: Former fracture no severe ulnar collateral ligament tear     Impression and Recommendations:    Assessment and Plan: 49 y.o. female with left thumb pain. Patient had fracture about 8 weeks ago and immobilization for fracture healing. She has resulting stiffness and discomfort at MCP and IP joint. Obvious neck step here is physical therapy. Plan to refer to hand PT along with Voltaren gel. Check back in 4 to 6 weeks. If not better next step is likely MRI arthrogram of MCP to evaluate further for ulnar collateral ligament tear that is not visible on ultrasound..   Orders Placed This Encounter  Procedures  . [place] LIMITED JOINT SPACE STRUCTURES LOW LEFT(NO LINKED CHARGES)    Order Specific Question:   Reason for Exam (SYMPTOM  OR DIAGNOSIS REQUIRED)    Answer:   L thumb pain    Order Specific Question:   Preferred imaging location?    Answer:   [facility]  . DG Finger Thumb Left    Standing Status:   Future    Number of Occurrences:   1    Standing Expiration Date:   12/24/2019    Order Specific Question:   Reason for Exam (SYMPTOM  OR DIAGNOSIS REQUIRED)    Answer:   L thumb pain    Order Specific Question:   Is patient pregnant?    Answer:   No    Order Specific Question:   Preferred imaging location?    Answer:  [facility]  . Ambulatory referral to Physical Therapy    Referral Priority:   Routine    Referral Type:   Physical Medicine    Referral Reason:   Specialty Services Required    Requested Specialty:   Physical Therapy    Number of Visits Requested:   1   No orders of the defined types were placed in this encounter.   Discussed warning signs or symptoms. Please see discharge instructions. Patient expresses understanding.   The above documentation has been reviewed and is accurate and complete Lynne Leader, M.D.

## 2019-12-06 DIAGNOSIS — M79645 Pain in left finger(s): Secondary | ICD-10-CM | POA: Diagnosis not present

## 2019-12-14 DIAGNOSIS — M79645 Pain in left finger(s): Secondary | ICD-10-CM | POA: Diagnosis not present

## 2019-12-20 DIAGNOSIS — M79645 Pain in left finger(s): Secondary | ICD-10-CM | POA: Diagnosis not present

## 2019-12-26 DIAGNOSIS — Z20822 Contact with and (suspected) exposure to covid-19: Secondary | ICD-10-CM | POA: Diagnosis not present

## 2019-12-26 DIAGNOSIS — Z03818 Encounter for observation for suspected exposure to other biological agents ruled out: Secondary | ICD-10-CM | POA: Diagnosis not present

## 2019-12-27 DIAGNOSIS — M79645 Pain in left finger(s): Secondary | ICD-10-CM | POA: Diagnosis not present

## 2020-01-11 DIAGNOSIS — M79645 Pain in left finger(s): Secondary | ICD-10-CM | POA: Diagnosis not present

## 2020-01-19 DIAGNOSIS — M79645 Pain in left finger(s): Secondary | ICD-10-CM | POA: Diagnosis not present

## 2020-01-26 DIAGNOSIS — M79645 Pain in left finger(s): Secondary | ICD-10-CM | POA: Diagnosis not present

## 2020-02-09 ENCOUNTER — Ambulatory Visit: Payer: Self-pay | Admitting: Podiatry

## 2020-02-09 DIAGNOSIS — M79645 Pain in left finger(s): Secondary | ICD-10-CM | POA: Diagnosis not present

## 2020-03-02 ENCOUNTER — Other Ambulatory Visit: Payer: Self-pay

## 2020-03-02 ENCOUNTER — Ambulatory Visit: Payer: BC Managed Care – PPO | Admitting: Sports Medicine

## 2020-03-02 ENCOUNTER — Encounter: Payer: Self-pay | Admitting: Sports Medicine

## 2020-03-02 DIAGNOSIS — H04123 Dry eye syndrome of bilateral lacrimal glands: Secondary | ICD-10-CM | POA: Insufficient documentation

## 2020-03-02 DIAGNOSIS — M79675 Pain in left toe(s): Secondary | ICD-10-CM

## 2020-03-02 DIAGNOSIS — M79674 Pain in right toe(s): Secondary | ICD-10-CM

## 2020-03-02 DIAGNOSIS — L603 Nail dystrophy: Secondary | ICD-10-CM

## 2020-03-02 DIAGNOSIS — L608 Other nail disorders: Secondary | ICD-10-CM | POA: Diagnosis not present

## 2020-03-02 DIAGNOSIS — M7741 Metatarsalgia, right foot: Secondary | ICD-10-CM

## 2020-03-02 DIAGNOSIS — E611 Iron deficiency: Secondary | ICD-10-CM | POA: Insufficient documentation

## 2020-03-02 DIAGNOSIS — M545 Low back pain, unspecified: Secondary | ICD-10-CM | POA: Insufficient documentation

## 2020-03-02 DIAGNOSIS — A499 Bacterial infection, unspecified: Secondary | ICD-10-CM | POA: Diagnosis not present

## 2020-03-02 DIAGNOSIS — M7742 Metatarsalgia, left foot: Secondary | ICD-10-CM

## 2020-03-02 DIAGNOSIS — N939 Abnormal uterine and vaginal bleeding, unspecified: Secondary | ICD-10-CM | POA: Insufficient documentation

## 2020-03-02 NOTE — Progress Notes (Signed)
Subjective: Michelle Moss is a 49 y.o. female patient seen today in office with complaint of mildly painful thickened and discolored nails. Patient is desiring treatment for nail changes; has tried OTC topicals in the past with no improvement. Reports that nails are becoming difficult to manage because of the thickness. Patient also reports that pain with flexion for 6 month that is getting better. Denies injury and voltaren has helped. Patient has no other pedal complaints at this time.   Review of Systems  All other systems reviewed and are negative.   Patient Active Problem List   Diagnosis Date Noted  . Abnormal vaginal bleeding 03/02/2020  . Dry eyes 03/02/2020  . Low back pain 03/02/2020  . Low iron 03/02/2020  . Family history of colon cancer in father 11/12/2017  . Monoallelic mutation of MUTYH gene 05/19/2016  . Abnormal mammogram of left breast 04/17/2016  . Bilateral fibrocystic breast disease (FCBD) 09/28/2015  . H/O syphilis 09/28/2015  . History of iron deficiency anemia 09/28/2015  . History of migraine headaches 09/28/2015  . History of vitamin D deficiency 09/28/2015  . Reduced libido 09/28/2015  . Vaginal dryness 09/28/2015  . Family history of breast cancer in sister 12/19/2013  . Menorrhagia 05/07/2012  . Fibroids 05/07/2012    Current Outpatient Medications on File Prior to Visit  Medication Sig Dispense Refill  . diclofenac Sodium (VOLTAREN) 1 % GEL Apply topically 4 (four) times daily.    . ergocalciferol (VITAMIN D2) 1.25 MG (50000 UT) capsule Vitamin D2 1,250 mcg (50,000 unit) capsule  TK ONE C PO  Q WEEK UTD    . Omega-3 Fatty Acids (FISH OIL PO) Take by mouth.     No current facility-administered medications on file prior to visit.    Allergies  Allergen Reactions  . No Known Allergies     Objective: Physical Exam  General: Well developed, nourished, no acute distress, awake, alert and oriented x 3  Vascular: Dorsalis pedis artery 2/4  bilateral, Posterior tibial artery 2/4 bilateral, skin temperature warm to warm proximal to distal bilateral lower extremities, no varicosities, pedal hair present bilateral.  Neurological: Gross sensation present via light touch bilateral.   Dermatological: Skin is warm, dry, and supple bilateral, Nails 1-10 are tender, short thick, and discolored with mild subungal debris, no webspace macerations present bilateral, no open lesions present bilateral, no callus/corns/hyperkeratotic tissue present bilateral. No signs of infection bilateral.  Musculoskeletal:+ Pes planus. No reproducible pain but subjective pain with flexion and extension of all toes. Muscular strength within normal limits without painon range of motion. No pain with calf compression bilateral.  Assessment and Plan:  Problem List Items Addressed This Visit   None   Visit Diagnoses    Onychodystrophy    -  Primary   Relevant Orders   Culture, fungus without smear   Pain in toes of both feet       Metatarsalgia of both feet          -Examined patient -Discussed treatment options for painful dystrophic nails  -Fungal culture was obtained by removing a portion of the hard nail itself from each of the involved toenails using a sterile nail nipper and sent to [facility] lab. Patient tolerated the biopsy procedure well without discomfort or need for anesthesia.  -Discussed treatment for foot pain -Gave patient met pads and advised patient if helps may benefit from orthotics; if pain worsens will xray  -Patient to return in 4 weeks for follow up evaluation and discussion  of fungal culture results or sooner if symptoms worsen.  Landis Martins, DPM

## 2020-03-03 DIAGNOSIS — R6889 Other general symptoms and signs: Secondary | ICD-10-CM | POA: Diagnosis not present

## 2020-03-03 DIAGNOSIS — D649 Anemia, unspecified: Secondary | ICD-10-CM | POA: Diagnosis not present

## 2020-03-03 DIAGNOSIS — R5383 Other fatigue: Secondary | ICD-10-CM | POA: Diagnosis not present

## 2020-03-23 ENCOUNTER — Ambulatory Visit: Payer: BC Managed Care – PPO | Admitting: Sports Medicine

## 2020-04-13 ENCOUNTER — Encounter: Payer: Self-pay | Admitting: Sports Medicine

## 2020-04-13 ENCOUNTER — Ambulatory Visit (INDEPENDENT_AMBULATORY_CARE_PROVIDER_SITE_OTHER): Payer: 59 | Admitting: Sports Medicine

## 2020-04-13 ENCOUNTER — Other Ambulatory Visit: Payer: Self-pay

## 2020-04-13 DIAGNOSIS — M79675 Pain in left toe(s): Secondary | ICD-10-CM

## 2020-04-13 DIAGNOSIS — L603 Nail dystrophy: Secondary | ICD-10-CM

## 2020-04-13 DIAGNOSIS — M79674 Pain in right toe(s): Secondary | ICD-10-CM | POA: Diagnosis not present

## 2020-04-13 DIAGNOSIS — M7741 Metatarsalgia, right foot: Secondary | ICD-10-CM | POA: Diagnosis not present

## 2020-04-13 DIAGNOSIS — M7742 Metatarsalgia, left foot: Secondary | ICD-10-CM

## 2020-04-13 NOTE — Progress Notes (Signed)
Subjective: Michelle Moss is a 50 y.o. female patient seen today in office for fungal culture results. Patient reports that her pain to ball of her foot is less an doesn't bother her as long as she doesn't wear certain shoes or do the thread-mill. Patient has no other pedal complaints at this time.    Patient Active Problem List   Diagnosis Date Noted  . Abnormal vaginal bleeding 03/02/2020  . Dry eyes 03/02/2020  . Low back pain 03/02/2020  . Low iron 03/02/2020  . Family history of colon cancer in father 11/12/2017  . Monoallelic mutation of MUTYH gene 05/19/2016  . Abnormal mammogram of left breast 04/17/2016  . Bilateral fibrocystic breast disease (FCBD) 09/28/2015  . H/O syphilis 09/28/2015  . History of iron deficiency anemia 09/28/2015  . History of migraine headaches 09/28/2015  . History of vitamin D deficiency 09/28/2015  . Reduced libido 09/28/2015  . Vaginal dryness 09/28/2015  . Family history of breast cancer in sister 12/19/2013  . Menorrhagia 05/07/2012  . Fibroids 05/07/2012    Current Outpatient Medications on File Prior to Visit  Medication Sig Dispense Refill  . diclofenac Sodium (VOLTAREN) 1 % GEL Apply topically 4 (four) times daily.    . ergocalciferol (VITAMIN D2) 1.25 MG (50000 UT) capsule Vitamin D2 1,250 mcg (50,000 unit) capsule  TK ONE C PO  Q WEEK UTD    . Omega-3 Fatty Acids (FISH OIL PO) Take by mouth.     No current facility-administered medications on file prior to visit.    Allergies  Allergen Reactions  . No Known Allergies     Objective: Physical Exam  General: Well developed, nourished, no acute distress, awake, alert and oriented x 3  Vascular: Dorsalis pedis artery 2/4 bilateral, Posterior tibial artery 2/4 bilateral, skin temperature warm to warm proximal to distal bilateral lower extremities, no varicosities, pedal hair present bilateral.  Neurological: Gross sensation present via light touch bilateral.   Dermatological: Skin  is warm, dry, and supple bilateral, Nails 1-10 are tender, short thick, and discolored with mild subungal debris, no webspace macerations present bilateral, no open lesions present bilateral, no callus/corns/hyperkeratotic tissue present bilateral. No signs of infection bilateral.  Musculoskeletal:+ Pes planus. No reproducible pain but subjective pain with flexion and extension of all toes that is better when avoiding over extension at ball R>L. Muscular strength within normal limits without painon range of motion. No pain with calf compression bilateral.  Fungal culture suggestive of microtrauma, bacteria, and lysis with saprophytic contameniant   Assessment and Plan:  Problem List Items Addressed This Visit   None   Visit Diagnoses    Onychodystrophy    -  Primary   Pain in toes of both feet       Metatarsalgia of both feet          -Examined patient -Discussed treatment options for painful dystrophic nails -Recommend tolcylen -Advised good hygiene habits -Advised patient to avoid any over stress to forefoot -Patient to return if fails to get better after 6 months or sooner if symptoms worsen. Advised patient if does not improve may benefit from laser or oral but there are risks  Landis Martins, DPM

## 2020-04-25 ENCOUNTER — Encounter: Payer: Self-pay | Admitting: Sports Medicine

## 2021-03-29 ENCOUNTER — Other Ambulatory Visit: Payer: Self-pay

## 2021-03-29 ENCOUNTER — Other Ambulatory Visit: Payer: Self-pay | Admitting: Family Medicine

## 2021-03-29 ENCOUNTER — Ambulatory Visit
Admission: RE | Admit: 2021-03-29 | Discharge: 2021-03-29 | Disposition: A | Discharge status: Home / Self Care | Payer: 59 | Source: Ambulatory Visit | Attending: Family Medicine | Admitting: Family Medicine

## 2021-03-29 DIAGNOSIS — R0602 Shortness of breath: Secondary | ICD-10-CM

## 2021-03-29 NOTE — Progress Notes (Signed)
Several day h/o progressive SOB and cough. COVID positive a couple weeks ago w/o full resolution

## 2021-05-16 ENCOUNTER — Ambulatory Visit (AMBULATORY_SURGERY_CENTER): Payer: 59 | Admitting: *Deleted

## 2021-05-16 ENCOUNTER — Other Ambulatory Visit: Payer: Self-pay

## 2021-05-16 VITALS — Ht 67.0 in | Wt 250.0 lb

## 2021-05-16 DIAGNOSIS — Z1211 Encounter for screening for malignant neoplasm of colon: Secondary | ICD-10-CM

## 2021-05-16 MED ORDER — NA SULFATE-K SULFATE-MG SULF 17.5-3.13-1.6 GM/177ML PO SOLN: 2.0000 | Freq: Once | ORAL | 0 refills | Status: AC

## 2021-05-16 NOTE — Progress Notes (Signed)

## 2021-05-25 ENCOUNTER — Encounter: Payer: Self-pay | Admitting: Internal Medicine

## 2021-05-29 ENCOUNTER — Encounter: Payer: 59 | Admitting: Gastroenterology

## 2021-06-01 ENCOUNTER — Ambulatory Visit (AMBULATORY_SURGERY_CENTER): Payer: 59 | Admitting: Internal Medicine

## 2021-06-01 ENCOUNTER — Encounter: Payer: Self-pay | Admitting: Internal Medicine

## 2021-06-01 ENCOUNTER — Other Ambulatory Visit: Payer: Self-pay

## 2021-06-01 ENCOUNTER — Other Ambulatory Visit: Payer: Self-pay | Admitting: Internal Medicine

## 2021-06-01 VITALS — BP 141/85 | HR 63 | Temp 97.8°F | Resp 18 | Ht 67.0 in | Wt 250.0 lb

## 2021-06-01 DIAGNOSIS — K635 Polyp of colon: Secondary | ICD-10-CM | POA: Diagnosis not present

## 2021-06-01 DIAGNOSIS — D123 Benign neoplasm of transverse colon: Secondary | ICD-10-CM | POA: Diagnosis not present

## 2021-06-01 DIAGNOSIS — D120 Benign neoplasm of cecum: Secondary | ICD-10-CM

## 2021-06-01 DIAGNOSIS — Z800 Family history of malignant neoplasm of digestive organs: Secondary | ICD-10-CM | POA: Diagnosis present

## 2021-06-01 MED ORDER — SODIUM CHLORIDE 0.9 % IV SOLN: 500.0000 mL | Freq: Once | INTRAVENOUS | Status: DC

## 2021-06-01 NOTE — Progress Notes (Signed)
? ?GASTROENTEROLOGY PROCEDURE H&P NOTE  ? ?Primary Care Physician: ?Waldemar Dickens, MD ? ? ? ?Reason for Procedure:  Colon cancer screening and patient whose father had colorectal cancer ? ?Plan:    Colonoscopy ? ?Patient is appropriate for endoscopic procedure(s) in the ambulatory ([facility]) setting. ? ?The nature of the procedure, as well as the risks, benefits, and alternatives were carefully and thoroughly reviewed with the patient. Ample time for discussion and questions allowed. The patient understood, was satisfied, and agreed to proceed.  ? ? ? ?HPI: ?Michelle Moss is a 51 y.o. female who presents for colonoscopy.  Medical history as below.  Tolerated the prep.  No recent chest pain or shortness of breath.  No abdominal pain today. ? ?Past Medical History:  ?Diagnosis Date  ? Anemia   ? Fibrocystic breast   ? Sleep apnea   ? in past-no issues  ? ? ?Past Surgical History:  ?Procedure Laterality Date  ? BREAST LUMPECTOMY  2011  ? BTL  2001  ? ROTATOR CUFF REPAIR  2011  ? TONSILECTOMY/ADENOIDECTOMY WITH MYRINGOTOMY  1995  ? TUBAL LIGATION    ? WRIST SURGERY    ? ? ?Prior to Admission medications   ?Medication Sig Start Date End Date Taking? Authorizing Provider  ?ergocalciferol (VITAMIN D2) 1.25 MG (50000 UT) capsule Take 50,000 Units by mouth daily.   Yes [provider]  ?Multiple Vitamin (MULTIVITAMIN) tablet Take 1 tablet by mouth daily.   Yes [provider]  ?Omega-3 Fatty Acids (FISH OIL PO) Take by mouth.   Yes [provider]  ?vitamin C (ASCORBIC ACID) 500 MG tablet Take 500 mg by mouth daily.   Yes [provider]  ?Zinc Sulfate (ZINC-220 PO) Take by mouth.   Yes [provider]  ?albuterol (VENTOLIN HFA) 108 (90 Base) MCG/ACT inhaler SMARTSIG:1 Puff(s) Via Inhaler Every 4 Hours PRN 03/29/21   [provider]  ?diclofenac Sodium (VOLTAREN) 1 % GEL Apply topically 4 (four) times daily. ?Patient not taking: Reported on 05/16/2021    [provider]  ?eletriptan (RELPAX) 40 MG tablet Take by mouth. ?Patient not taking: Reported on 05/16/2021 01/10/21   [provider]  ? ? ?Current Outpatient Medications  ?Medication Sig Dispense Refill  ? ergocalciferol (VITAMIN D2) 1.25 MG (50000 UT) capsule Take 50,000 Units by mouth daily.    ? Multiple Vitamin (MULTIVITAMIN) tablet Take 1 tablet by mouth daily.    ? Omega-3 Fatty Acids (FISH OIL PO) Take by mouth.    ? vitamin C (ASCORBIC ACID) 500 MG tablet Take 500 mg by mouth daily.    ? Zinc Sulfate (ZINC-220 PO) Take by mouth.    ? albuterol (VENTOLIN HFA) 108 (90 Base) MCG/ACT inhaler SMARTSIG:1 Puff(s) Via Inhaler Every 4 Hours PRN    ? diclofenac Sodium (VOLTAREN) 1 % GEL Apply topically 4 (four) times daily. (Patient not taking: Reported on 05/16/2021)    ? eletriptan (RELPAX) 40 MG tablet Take by mouth. (Patient not taking: Reported on 05/16/2021)    ? ?Current Facility-Administered Medications  ?Medication Dose Route Frequency Provider Last Rate Last Admin  ? 0.9 %  sodium chloride infusion  500 mL Intravenous Once Jazlyne Gauger, Lajuan Lines, MD      ? ? ?Allergies as of 06/01/2021 - Review Complete 06/01/2021  ?Allergen Reaction Noted  ? No known allergies  03/02/2020  ? ? ?Family History  ?Problem Relation Age of Onset  ? Arthritis Mother   ? Colon cancer Father   ?  Diabetes Father   ? Heart disease Father   ? Colon polyps Neg Hx   ? Esophageal cancer Neg Hx   ? Stomach cancer Neg Hx   ? Rectal cancer Neg Hx   ? ? ?Social History  ? ?Socioeconomic History  ? Marital status: Married  ?  Spouse name: Not on file  ? Number of children: Not on file  ? Years of education: Not on file  ? Highest education level: Not on file  ?Occupational History  ? Not on file  ?Tobacco Use  ? Smoking status: Never  ? Smokeless tobacco: Never  ?Vaping Use  ? Vaping Use: Never used  ?Substance and Sexual Activity  ? Alcohol use: Not Currently  ?  Comment: social  ? Drug use: No  ? Sexual activity: Yes  ?  Birth control/protection: Surgical   ?  Comment: BTL  ?Other Topics Concern  ? Not on file  ?Social History Narrative  ? Not on file  ? ?Social Determinants of Health  ? ?Financial Resource Strain: Not on file  ?Food Insecurity: Not on file  ?Transportation Needs: Not on file  ?Physical Activity: Not on file  ?Stress: Not on file  ?Social Connections: Not on file  ?Intimate Partner Violence: Not on file  ? ? ?Physical Exam: ?Vital signs in last 24 hours: ?'@BP'$  137/76   Pulse 75   Temp 97.8 ?F (36.6 ?C) (Temporal)   Ht '5\' 7"'$  (1.702 m)   Wt 250 lb (113.4 kg)   SpO2 98%   BMI 39.16 kg/m?  ?GEN: NAD ?EYE: Sclerae anicteric ?ENT: MMM ?CV: Non-tachycardic ?Pulm: CTA b/l ?GI: Soft, NT/ND ?NEURO:  Alert & Oriented x 3 ? ? ?Michelle Jarred, MD ?[facility] ? ?06/01/2021 11:14 AM ? ?

## 2021-06-01 NOTE — Progress Notes (Signed)
Pt awake, report to RN, VVS  °

## 2021-06-01 NOTE — Progress Notes (Signed)
Called to room to assist during endoscopic procedure.  Patient ID and intended procedure confirmed with present staff. Received instructions for my participation in the procedure from the performing physician.  

## 2021-06-01 NOTE — Op Note (Signed)
[place] ?Patient Name: Michelle Moss ?Procedure Date: 06/01/2021 11:14 AM ?MRN: [id number] ?Endoscopist: Jerene Bears , MD ?Age: 51 ?Referring MD:  ?Date of Birth: 07-01-1970 ?Gender: Female ?Account #: [id number] ?Procedure:                Colonoscopy ?Indications:              Screening patient at increased risk: Family history  ?                          of 1st-degree relative with colorectal cancer at  ?                          age 8 years (or older), This is the patient's  ?                          first colonoscopy ?Medicines:                Monitored Anesthesia Care ?Procedure:                Pre-Anesthesia Assessment: ?                          - Prior to the procedure, a History and Physical  ?                          was performed, and patient medications and  ?                          allergies were reviewed. The patient's tolerance of  ?                          previous anesthesia was also reviewed. The risks  ?                          and benefits of the procedure and the sedation  ?                          options and risks were discussed with the patient.  ?                          All questions were answered, and informed consent  ?                          was obtained. Prior Anticoagulants: The patient has  ?                          taken no previous anticoagulant or antiplatelet  ?                          agents. ASA Grade Assessment: III - A patient with  ?                          severe systemic disease. After reviewing the risks  ?  and benefits, the patient was deemed in  ?                          satisfactory condition to undergo the procedure. ?                          After obtaining informed consent, the colonoscope  ?                          was passed under direct vision. Throughout the  ?                          procedure, the patient's blood pressure, pulse, and  ?                          oxygen saturations were monitored  continuously. The  ?                          [place] #[id number] was introduced through  ?                          the anus and advanced to the cecum, identified by  ?                          appendiceal orifice and ileocecal valve. The  ?                          colonoscopy was performed without difficulty. The  ?                          patient tolerated the procedure well. The quality  ?                          of the bowel preparation was good. The ileocecal  ?                          valve, appendiceal orifice, and rectum were  ?                          photographed. ?Scope In: 11:27:30 AM ?Scope Out: 11:44:25 AM ?Scope Withdrawal Time: 0 hours 14 minutes 30 seconds  ?Total Procedure Duration: 0 hours 16 minutes 55 seconds  ?Findings:                 The digital rectal exam was normal. ?                          Three sessile polyps were found in the cecum. The  ?                          polyps were 3 to 5 mm in size. These polyps were  ?                          removed with a cold snare. Resection and retrieval  ?  were complete. ?                          A 5 mm polyp was found in the proximal ascending  ?                          colon. The polyp was sessile. The polyp was removed  ?                          with a cold snare. Resection and retrieval were  ?                          complete. ?                          Multiple small-mouthed diverticula were found in  ?                          the sigmoid colon and descending colon. ?                          The retroflexed view of the distal rectum and anal  ?                          verge was normal and showed no anal or rectal  ?                          abnormalities. ?Complications:            No immediate complications. ?Estimated Blood Loss:     Estimated blood loss was minimal. ?Impression:               - Three 3 to 5 mm polyps in the cecum, removed with  ?                          a cold snare. Resected  and retrieved. ?                          - One 5 mm polyp in the proximal ascending colon,  ?                          removed with a cold snare. Resected and retrieved. ?                          - Diverticulosis in the sigmoid colon and in the  ?                          descending colon. ?                          - The distal rectum and anal verge are normal on  ?                          retroflexion view. ?Recommendation:           -  Patient has a contact number available for  ?                          emergencies. The signs and symptoms of potential  ?                          delayed complications were discussed with the  ?                          patient. Return to normal activities tomorrow.  ?                          Written discharge instructions were provided to the  ?                          patient. ?                          - Resume previous diet. ?                          - Continue present medications. ?                          - Await pathology results. ?                          - Repeat colonoscopy is recommended for  ?                          surveillance. The colonoscopy date will be  ?                          determined after pathology results from today's  ?                          exam become available for review. ?Jerene Bears, MD ?06/01/2021 11:47:12 AM ?This report has been signed electronically. ?

## 2021-06-01 NOTE — Progress Notes (Signed)
Pt's states no medical or surgical changes since previsit or office visit. 

## 2021-06-01 NOTE — Patient Instructions (Signed)
Handout on polyps given and diverticulosis given. ? ? ?YOU HAD AN ENDOSCOPIC PROCEDURE TODAY AT [facility]:   Refer to the procedure report that was given to you for any specific questions about what was found during the examination.  If the procedure report does not answer your questions, please call your gastroenterologist to clarify.  If you requested that your care partner not be given the details of your procedure findings, then the procedure report has been included in a sealed envelope for you to review at your convenience later. ? ?YOU SHOULD EXPECT: Some feelings of bloating in the abdomen. Passage of more gas than usual.  Walking can help get rid of the air that was put into your GI tract during the procedure and reduce the bloating. If you had a lower endoscopy (such as a colonoscopy or flexible sigmoidoscopy) you may notice spotting of blood in your stool or on the toilet paper. If you underwent a bowel prep for your procedure, you may not have a normal bowel movement for a few days. ? ?Please Note:  You might notice some irritation and congestion in your nose or some drainage.  This is from the oxygen used during your procedure.  There is no need for concern and it should clear up in a day or so. ? ?SYMPTOMS TO REPORT IMMEDIATELY: ? ?Following lower endoscopy (colonoscopy or flexible sigmoidoscopy): ? Excessive amounts of blood in the stool ? Significant tenderness or worsening of abdominal pains ? Swelling of the abdomen that is new, acute ? Fever of 100?F or higher ? ? ?For urgent or emergent issues, a gastroenterologist can be reached at any hour by calling [phone]. ?Do not use MyChart messaging for urgent concerns.  ? ? ?DIET:  We do recommend a small meal at first, but then you may proceed to your regular diet.  Drink plenty of fluids but you should avoid alcoholic beverages for 24 hours. ? ?ACTIVITY:  You should plan to take it easy for the rest of today and you should NOT  DRIVE or use heavy machinery until tomorrow (because of the sedation medicines used during the test).   ? ?FOLLOW UP: ?Our staff will call the number listed on your records 48-72 hours following your procedure to check on you and address any questions or concerns that you may have regarding the information given to you following your procedure. If we do not reach you, we will leave a message.  We will attempt to reach you two times.  During this call, we will ask if you have developed any symptoms of COVID 19. If you develop any symptoms (ie: fever, flu-like symptoms, shortness of breath, cough etc.) before then, please call [phone].  If you test positive for Covid 19 in the 2 weeks post procedure, please call and report this information to [place].   ? ?If any biopsies were taken you will be contacted by phone or by letter within the next 1-3 weeks.  Please call us at [phone] if you have not heard about the biopsies in 3 weeks.  ? ? ?SIGNATURES/CONFIDENTIALITY: ?You and/or your care partner have signed paperwork which will be entered into your electronic medical record.  These signatures attest to the fact that that the information above on your After Visit Summary has been reviewed and is understood.  Full responsibility of the confidentiality of this discharge information lies with you and/or your care-partner.  ?

## 2021-06-05 ENCOUNTER — Telehealth: Payer: Self-pay | Admitting: *Deleted

## 2021-06-05 NOTE — Telephone Encounter (Signed)
Attempted f/u phone call. No answer. Left message. °

## 2021-06-05 NOTE — Telephone Encounter (Signed)
Attempted 2nd f/u phone call. No answer. Left message.  °

## 2021-06-06 ENCOUNTER — Encounter: Payer: Self-pay | Admitting: Internal Medicine

## 2021-07-05 IMAGING — CR RIGHT KNEE - COMPLETE 4+ VIEW
4 series · 4 of 4 positions shown · non-contrast
Comparison: Report from knee MRI 05/19/2018, images not available.
Radiograph 01/26/2018

CLINICAL DATA: Right knee pain for 1 week. No known injury.

EXAM:
RIGHT KNEE - COMPLETE 4+ VIEW

[t knee ap right]
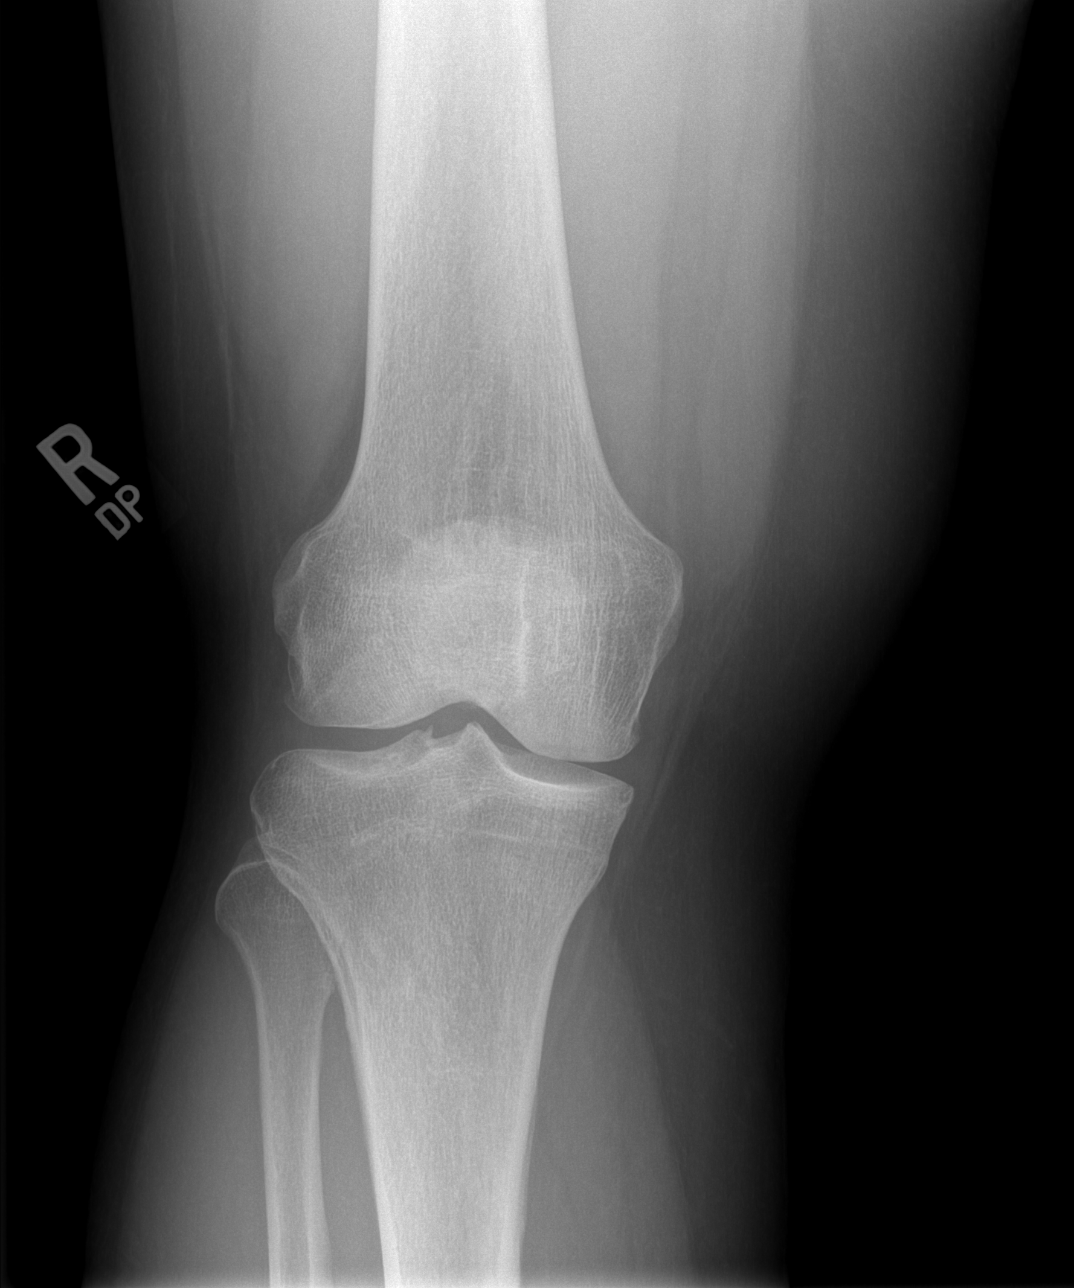

[t knee oblique right (1 of 2)]
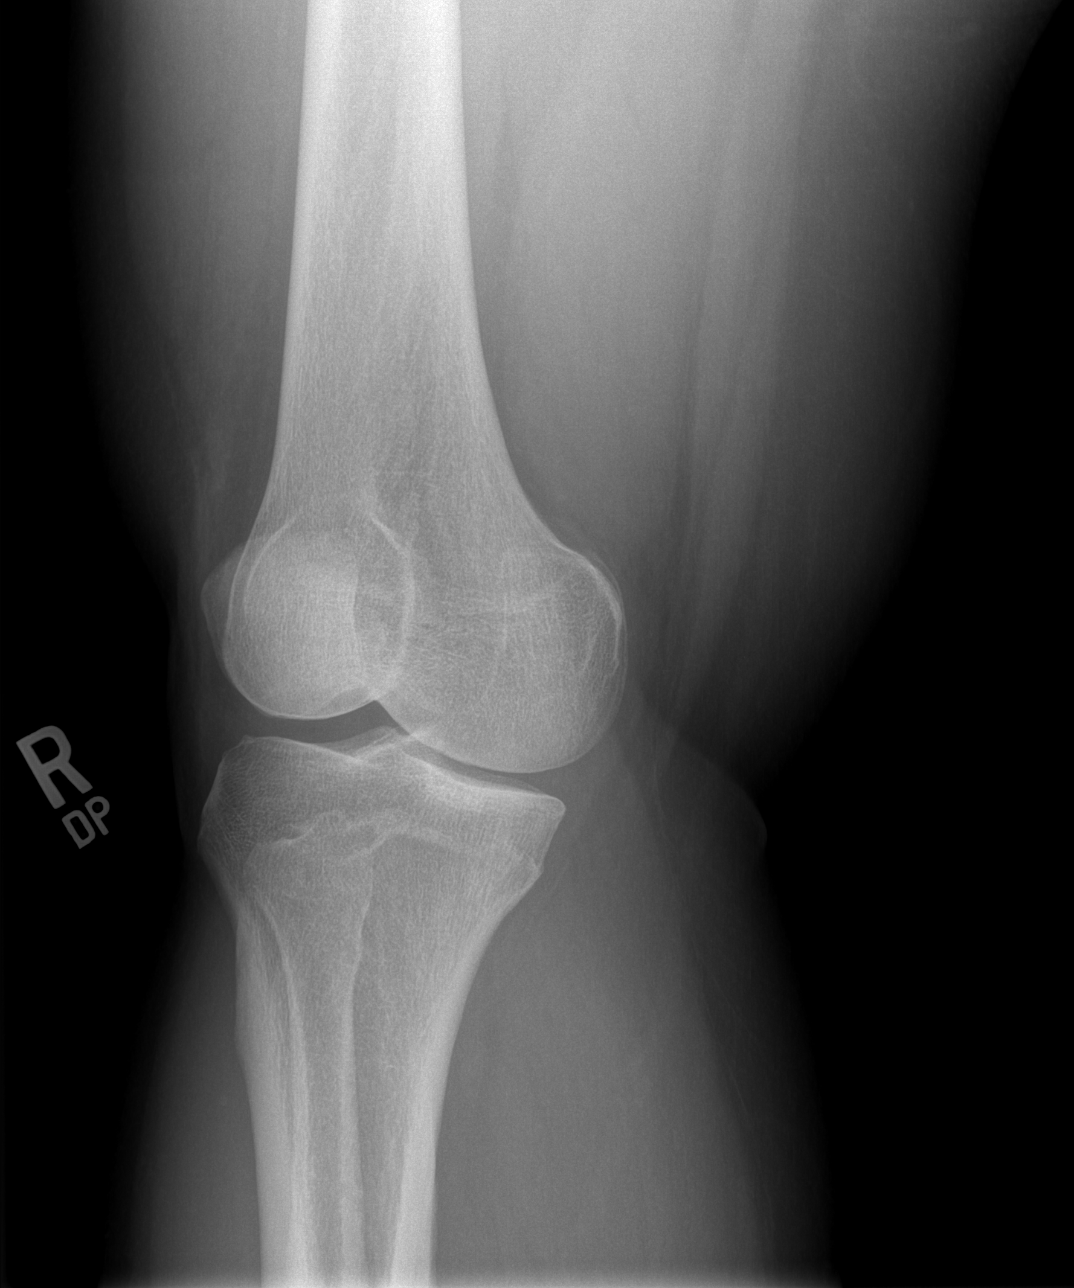

[t knee oblique right (2 of 2)]
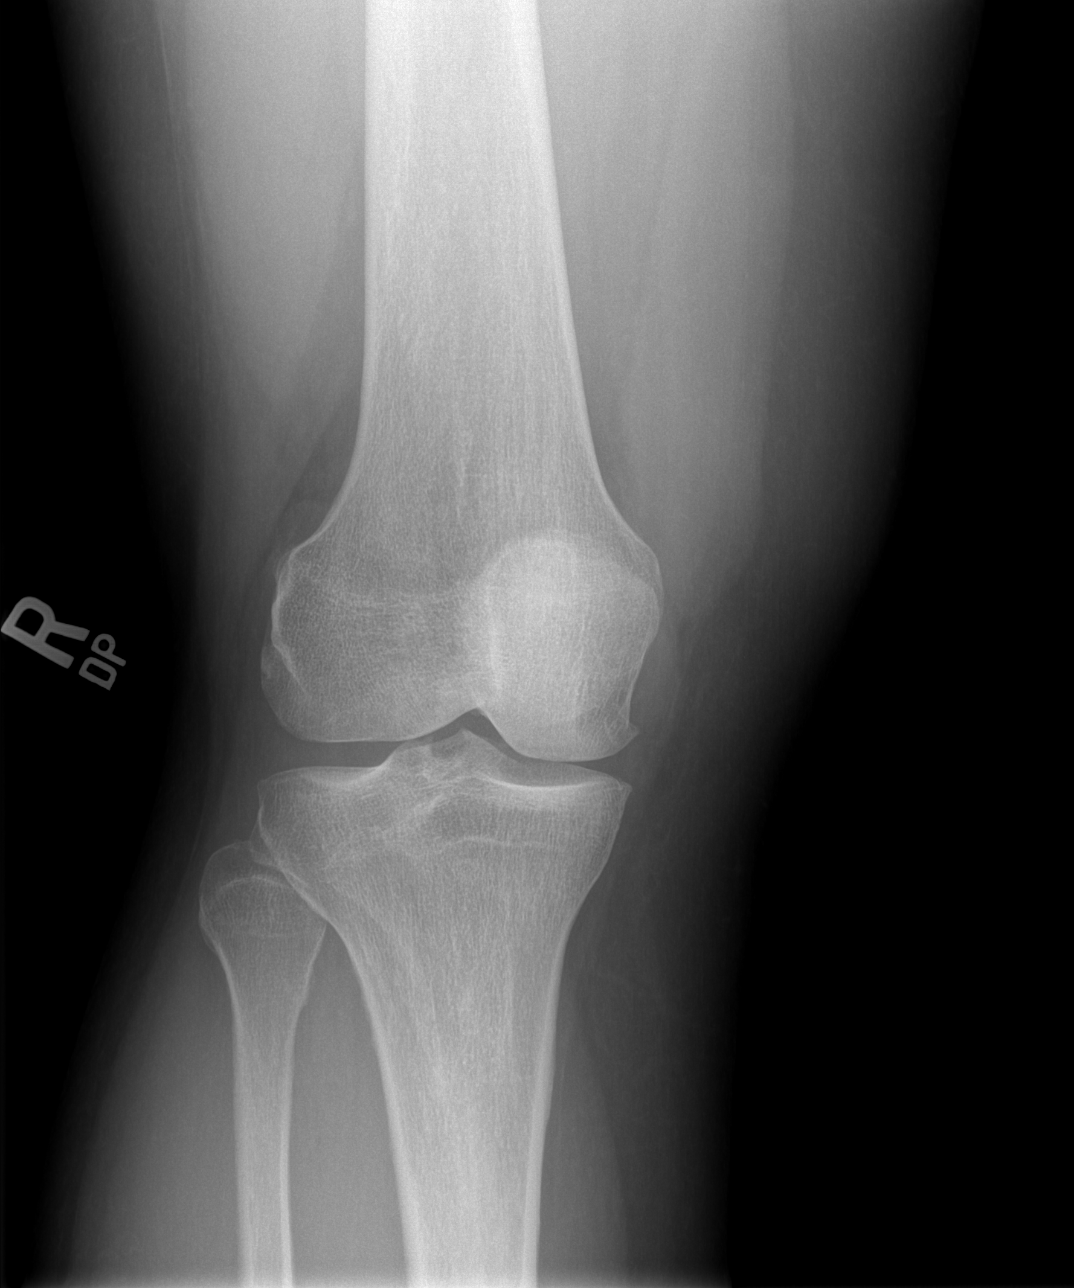

[t knee lat right]
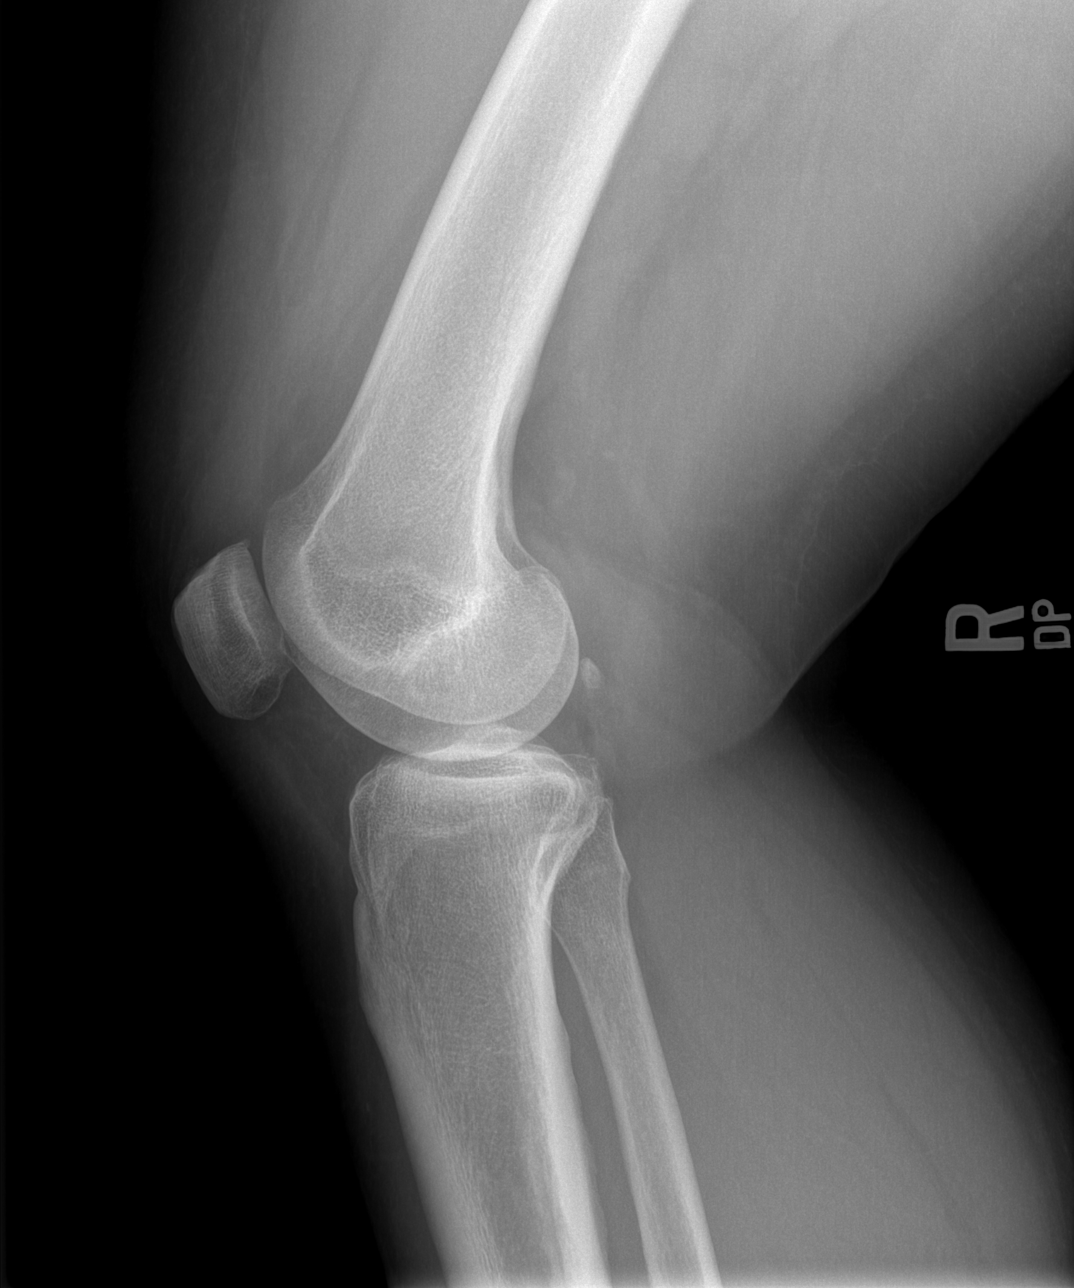

[4 of 4 positions shown; findings below may reference images not displayed]

FINDINGS: No evidence of fracture, dislocation, or joint effusion. Mild
patellofemoral and medial compartment peripheral spurring with
preservation of joint spaces. No other evidence of arthropathy or
other focal bone abnormality. Soft tissues are unremarkable.
IMPRESSION: Mild patellofemoral and medial tibiofemoral spurring with
preservation of joint spaces. No acute osseous abnormality.

## 2021-10-15 NOTE — Progress Notes (Unsigned)
   I, Peterson Lombard, LAT, ATC acting as a scribe for Lynne Leader, MD.  Subjective:    CC: R knee pain  HPI: Pt is a 51 y/o female c/o R knee pain. Pt was previously seen by Dr. Georgina Snell in 2021 for a L thumb fx. Today, pt c/o R knee pain x /. Pt locates pain to  R knee swelling: Mechanical symptoms: Aggravates: Treatments tried:  Dx imaging: 02/17/19 R knee MRI  11/06/18 R knee XR  Pertinent review of Systems: ***  Relevant historical information: ***   Objective:   There were no vitals filed for this visit. General: Well Developed, well nourished, and in no acute distress.   MSK: ***  Lab and Radiology Results No results found for this or any previous visit (from the past 72 hour(s)). No results found.    Impression and Recommendations:    Assessment and Plan: 51 y.o. female with ***.  PDMP not reviewed this encounter. No orders of the defined types were placed in this encounter.  No orders of the defined types were placed in this encounter.   Discussed warning signs or symptoms. Please see discharge instructions. Patient expresses understanding.   ***

## 2021-10-16 ENCOUNTER — Ambulatory Visit: Payer: Self-pay

## 2021-10-16 ENCOUNTER — Ambulatory Visit (INDEPENDENT_AMBULATORY_CARE_PROVIDER_SITE_OTHER)
Admission: RE | Admit: 2021-10-16 | Discharge: 2021-10-16 | Disposition: A | Discharge status: Home / Self Care | Payer: 59 | Source: Ambulatory Visit | Attending: Family Medicine | Admitting: Family Medicine

## 2021-10-16 ENCOUNTER — Ambulatory Visit: Payer: 59 | Admitting: Family Medicine

## 2021-10-16 VITALS — BP 124/82 | HR 77 | Ht 67.0 in | Wt 243.2 lb

## 2021-10-16 DIAGNOSIS — G8929 Other chronic pain: Secondary | ICD-10-CM

## 2021-10-16 DIAGNOSIS — Z6838 Body mass index (BMI) 38.0-38.9, adult: Secondary | ICD-10-CM | POA: Diagnosis not present

## 2021-10-16 DIAGNOSIS — M25561 Pain in right knee: Secondary | ICD-10-CM

## 2021-10-16 NOTE — Patient Instructions (Addendum)
Thank you for coming in today.   85-100 grams of protein a day  Get an xray of that knee at our [place] office.  Please use Voltaren gel (Generic Diclofenac Gel) up to 4x daily for pain as needed.  This is available over-the-counter as both the name brand Voltaren gel and the generic diclofenac gel.   You received an injection today. Seek immediate medical attention if the joint becomes red, extremely painful, or is oozing fluid.   Check back in 2 months

## 2021-10-17 NOTE — Progress Notes (Signed)
Right knee x-ray shows medium arthritis

## 2021-12-19 ENCOUNTER — Ambulatory Visit: Payer: 59 | Admitting: Family Medicine

## 2021-12-26 ENCOUNTER — Ambulatory Visit: Payer: 59 | Admitting: Family Medicine

## 2021-12-26 VITALS — BP 110/76 | HR 51 | Ht 67.0 in | Wt 232.0 lb

## 2021-12-26 DIAGNOSIS — G8929 Other chronic pain: Secondary | ICD-10-CM | POA: Diagnosis not present

## 2021-12-26 DIAGNOSIS — M25561 Pain in right knee: Secondary | ICD-10-CM

## 2021-12-26 NOTE — Progress Notes (Signed)
   I, Peterson Lombard, LAT, ATC acting as a scribe for Lynne Leader, MD.  Michelle Moss is a 51 y.o. female who presents to [place] at [facility] today for f/u R knee pain due to medial DJD and patellofemoral DJD. Pt was last seen by Dr. Georgina Snell on 10/16/21 and was given a R knee steroid injection and was advised to continue home exercise program Voltaren gel, and discussion was had about weight loss strategies.  Today, patient reports R knee is feeling somewhat better. Pt has started to jog on the treadmill and she notices pain later in the day. Soon after her last visit, she tripped and fell, landing on her R knee.   Dx imaging: 10/16/21 R knee XR 02/17/19 R knee MRI             11/06/18 R knee XR  Pertinent review of systems: No fevers or chills  Relevant historical information: No fevers or chills   Exam:  BP 110/76   Pulse (!) 51   Ht '5\' 7"'$  (1.702 m)   Wt 232 lb (105.2 kg)   SpO2 99%   BMI 36.34 kg/m  General: Well Developed, well nourished, and in no acute distress.   MSK: Right knee normal-appearing normal motion.    Lab and Radiology Results  EXAM: RIGHT KNEE 3 VIEWS   COMPARISON:  Right knee radiographs 11/06/2018   FINDINGS: Moderate medial compartment joint space narrowing and peripheral osteophytosis. Minimal superior and lateral patellar degenerative osteophytosis. Mild lateral patellofemoral joint space narrowing. Mild peripheral medial and lateral trochlear degenerative osteophytosis on patellar sunrise view. Tiny joint effusion. Mild chronic enthesopathic change at the quadriceps insertion on the patella.   IMPRESSION: 1. Moderate medial compartment osteoarthritis has progressed compared to 11/06/2018. 2. Mild patellofemoral osteoarthritis. 3. Tiny joint effusion.     Electronically Signed   By: Yvonne Kendall M.D.   On: 10/16/2021 16:27   I, Lynne Leader, personally (independently) visualized and performed the interpretation of the  images attached in this note.      Assessment and Plan: 51 y.o. female with right knee pain.  Chronic pain improved with steroid injection about 2 months ago.  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  Spent time talking about some strategies today.  Check back as needed.  Can repeat steroid injections every 3 months which would be early November.  Total encounter time 20 minutes including face-to-face time with the patient and, reviewing past medical record, and charting on the date of service.     Discussed warning signs or symptoms. Please see discharge instructions. Patient expresses understanding.   The above documentation has been reviewed and is accurate and complete Lynne Leader, M.D.

## 2021-12-26 NOTE — Patient Instructions (Signed)
Thank you for coming in today.   Continue exercise.   Recheck as needed.

## 2022-02-25 ENCOUNTER — Other Ambulatory Visit: Payer: Self-pay | Admitting: General Surgery

## 2022-02-25 DIAGNOSIS — Z803 Family history of malignant neoplasm of breast: Secondary | ICD-10-CM

## 2022-02-25 DIAGNOSIS — Z1239 Encounter for other screening for malignant neoplasm of breast: Secondary | ICD-10-CM

## 2022-03-05 ENCOUNTER — Encounter: Payer: Self-pay | Admitting: Family Medicine

## 2022-03-06 ENCOUNTER — Ambulatory Visit: Payer: 59 | Admitting: Sports Medicine

## 2022-03-06 VITALS — BP 132/82 | HR 62 | Ht 67.0 in | Wt 228.0 lb

## 2022-03-06 DIAGNOSIS — M25561 Pain in right knee: Secondary | ICD-10-CM | POA: Diagnosis not present

## 2022-03-06 DIAGNOSIS — M1711 Unilateral primary osteoarthritis, right knee: Secondary | ICD-10-CM

## 2022-03-06 DIAGNOSIS — G8929 Other chronic pain: Secondary | ICD-10-CM | POA: Diagnosis not present

## 2022-03-06 NOTE — Patient Instructions (Addendum)
Good to see you  Knee HEP Tylenol [phone] mg 2-3 times a day for pain relief  Recommend getting out of the car every 2-4 hours when on long drives Call us when you need [place]

## 2022-03-06 NOTE — Progress Notes (Signed)
Michelle Moss D.[place] Phone: [phone]   Assessment and Plan:     1. Chronic pain of right knee 2. Primary osteoarthritis of right knee  -Chronic with exacerbation, subsequent visit - Consistent with flare of osteoarthritis with patient receiving significant relief after intra-articular CSI on 10/16/2021, however pain returning over the past several weeks - Patient elected for repeat CSI.  Tolerated well per note below - Start Tylenol 500 to 1000 mg tablets 2-3 times a day for day-to-day pain relief - Start HEP for knee - Recommended getting out of the car every 2-4 hours during long trips to stretch and prevent progression of knee pain and stiffness  Procedure: Knee Joint Injection Side: Right Indication: Flare of osteoarthritis  Risks explained and consent was given verbally. The site was cleaned with alcohol prep. A needle was introduced with an anterio-lateral approach. Injection given using 46m of 1% lidocaine without epinephrine and 133mof kenalog '40mg'$ /ml. This was well tolerated and resulted in symptomatic relief.  Needle was removed, hemostasis achieved, and post injection instructions were explained.   Pt was advised to call or return to clinic if these symptoms worsen or fail to improve as anticipated.   Pertinent previous records reviewed include knee x-ray 10/16/2021 showing moderate medial compartment OA   Follow Up: As needed if no improvement or worsening of symptoms.  Could consider repeat CSI if patient receives at least 3 months relief or could consider alternative injections such as Zilretta, HA, PRP   Subjective:   I, Michelle Moss, am serving as a scEducation administratoror Doctor BeGlennon Moss Complaint: right knee pain   HPI:  12/26/2021 Michelle Moss a [age].o. female [facility]u R knee pain due to medial DJD and patellofemoral  DJD. Pt was last seen by Dr. CoGeorgina Snelln 10/16/21 and was given a R knee steroid injection and was advised to continue home exercise program Voltaren gel, and discussion was had about weight loss strategies.  Today, patient reports R knee is feeling somewhat better. Pt has started to jog on the treadmill and she notices pain later in the day. Soon after her last visit, she tripped and fell, landing on her R knee.    Dx imaging: 10/16/21 R knee XR 02/17/19 R knee MRI             11/06/18 R knee XR   03/06/22 Patient states that she wants a repeat CSI last week and this week constant pain is going out of town this weekend   Relevant Historical Information: None pertinent  Additional pertinent review of systems negative.   Current Outpatient Medications:    ergocalciferol (VITAMIN D2) 1.25 MG (50000 UT) capsule, Take 50,000 Units by mouth daily., Disp: , Rfl:    Multiple Vitamin (MULTIVITAMIN) tablet, Take 1 tablet by mouth daily., Disp: , Rfl:    Omega-3 Fatty Acids (FISH OIL PO), Take by mouth., Disp: , Rfl:    vitamin C (ASCORBIC ACID) 500 MG tablet, Take 500 mg by mouth daily., Disp: , Rfl:    Zinc Sulfate (ZINC-220 PO), Take by mouth., Disp: , Rfl:    Objective:     Vitals:   03/06/22 1449  BP: 132/82  Pulse: 62  SpO2: 98%  Weight: 228 lb (103.4 kg)  Height: '5\' 7"'$  (1.702 m)      Body mass index is 35.71 kg/m.  Physical Exam:    General:  awake, alert oriented, no acute distress nontoxic Skin: no suspicious lesions or rashes Neuro:sensation intact and strength 5/5 with no deficits, no atrophy, normal muscle tone Psych: No signs of anxiety, depression or other mood disorder  Right knee: Mild swelling No deformity Neg fluid wave, joint milking ROM Flex 110, Ext 0 NTTP over the quad tendon, medial fem condyle, lat fem condyle, patella, plica, patella tendon, tibial tuberostiy, fibular head, posterior fossa, pes anserine bursa, gerdy's tubercle, medial jt line, lateral jt  line Neg anterior and posterior drawer Neg lachman Neg sag sign Negative varus stress Negative valgus stress Positive McMurray   Gait normal    Electronically signed by:  Michelle Moss D.[facility] 3:15 PM 03/06/22

## 2022-03-18 HISTORY — PX: KNEE SURGERY: SHX244

## 2022-03-20 ENCOUNTER — Ambulatory Visit: Payer: 59 | Admitting: Family Medicine

## 2022-03-27 ENCOUNTER — Other Ambulatory Visit: Payer: 59

## 2022-04-29 ENCOUNTER — Telehealth: Payer: Self-pay

## 2022-04-29 ENCOUNTER — Ambulatory Visit: Payer: 59 | Admitting: Family Medicine

## 2022-04-29 VITALS — BP 152/98 | HR 60 | Ht 67.0 in | Wt 236.0 lb

## 2022-04-29 DIAGNOSIS — G8929 Other chronic pain: Secondary | ICD-10-CM | POA: Diagnosis not present

## 2022-04-29 DIAGNOSIS — M1711 Unilateral primary osteoarthritis, right knee: Secondary | ICD-10-CM

## 2022-04-29 DIAGNOSIS — M25561 Pain in right knee: Secondary | ICD-10-CM | POA: Diagnosis not present

## 2022-04-29 NOTE — Telephone Encounter (Signed)
Needs VOB for Zilretta R knee.

## 2022-04-29 NOTE — Progress Notes (Signed)
   I, Peterson Lombard, LAT, ATC acting as a scribe for Lynne Leader, MD.  Michelle Moss is a 52 y.o. female who presents to [place] at [facility] today for cont'd R knee pain. Pt was last seen by Dr. Glennon Mac on 03/06/22 and she was given a repeat R knee steroid injection and was advised to use Tylenol 500 to 1000 mg bid or tid, and taught HEP.  She also uses Aleve for pain control.  Today, pt reports prior steroid injection only lasted about 2 weeks. Pt notes the R knee has really worsened over this past week.  She feels some occasional instability in her knee.  She notes pain is worse with activity.  Dx imaging: 10/16/21 R knee XR 02/17/19 R knee MRI             11/06/18 R knee XR  Pertinent review of systems: No fevers or chills  Relevant historical information: Iron deficiency anemia   Exam:  BP (!) 152/98   Pulse 60   Ht '5\' 7"'$  (1.702 m)   Wt 236 lb (107 kg)   SpO2 97%   BMI 36.96 kg/m  General: Well Developed, well nourished, and in no acute distress.   MSK: Right knee: Normal-appearing normal motion. Intact strength. Tender palpation medial joint line. Slight laxity/instability MCL stress test.     Lab and Radiology Results   EXAM: RIGHT KNEE 3 VIEWS   COMPARISON:  Right knee radiographs 11/06/2018   FINDINGS: Moderate medial compartment joint space narrowing and peripheral osteophytosis. Minimal superior and lateral patellar degenerative osteophytosis. Mild lateral patellofemoral joint space narrowing. Mild peripheral medial and lateral trochlear degenerative osteophytosis on patellar sunrise view. Tiny joint effusion. Mild chronic enthesopathic change at the quadriceps insertion on the patella.   IMPRESSION: 1. Moderate medial compartment osteoarthritis has progressed compared to 11/06/2018. 2. Mild patellofemoral osteoarthritis. 3. Tiny joint effusion.     Electronically Signed   By: Yvonne Kendall M.D.   On: 10/16/2021 16:27 I, Lynne Leader, personally (independently) visualized and performed the interpretation of the images attached in this note.    Assessment and Plan: 52 y.o. female with right knee pain thought to be due to exacerbation of DJD.  She has medial compartment osteoarthritis on x-ray from August 2023.  She had a steroid injection about 6 weeks ago with only 2 weeks of benefit.  Work on authorization for hyaluronic acid injections and Zilretta injections.  She is a good candidate for medial off loader knee brace due to the knee pain and instability seen exam. Return in the near future to proceed with HA or Zilretta injection.      Discussed warning signs or symptoms. Please see discharge instructions. Patient expresses understanding.   The above documentation has been reviewed and is accurate and complete Lynne Leader, M.D.

## 2022-04-29 NOTE — Telephone Encounter (Signed)
VOB submitted for Gelsyn-3.  PA initiated via [facility] provider portal for [employer].   Your Authorization Request Is Pending  Your request number is [id number]. Your request may require review by our clinical team. Also, if additional information is needed to make a determination, we will reach out to you via the contact information provided below. Please see below for details regarding your request.  Request Status Pending Request Number [id number]

## 2022-04-29 NOTE — Telephone Encounter (Signed)
Needs VOB for visco R knee (also checking Zilretta).

## 2022-04-29 NOTE — Telephone Encounter (Signed)
No prior authorization, medical notes or referrals needed. Patient has a Self Funded, Therapist, art through employer [employer] with an effective date of 03/18/2022. Plan follows UHC guidelines. [id number] Orvan Seen) is covered at 100% by the payer at the contracted rate with no patient responsibility. Patient responsibility for CPT code 20611 will be 15% coinsurance with the remaining covered at 85% by the payer at the contracted rate. Patient has a deductible of $500 and has met $0. Deductible must be met for coverage to apply. Specialist office visits if billed are covered after a $30 copay. Patient has an out of pocket maximum of $3000 and has accumulated $929.77. If out of pocket is met, coverage goes to 100% and copay will no longer apply. Patient has a calendar year policy starting from March 18, 2022 to March 18, 2023.

## 2022-04-29 NOTE — Patient Instructions (Addendum)
Thank you for coming in today.   We will work to authorize gel shots and Zilretta  I will also ask Hoyle Sauer to contact you about the knee brace.   You should hear form my office in the next week or two about gel shots and Zilretta.

## 2022-04-29 NOTE — Telephone Encounter (Signed)
VOB initiated for Zilretta, R knee.

## 2022-04-29 NOTE — Telephone Encounter (Signed)
Prior auth APPROVED  PA# [id number] Valid: 04/29/22-04/30/23

## 2022-04-30 NOTE — Telephone Encounter (Signed)
Pt ready for scheduling on or after 04/30/22  Primary: [facility] [employer] Zilretta co-insurance: 0% [id number] co-insurance: 15%  Secondary: n/a Zilretta co-insurance:  [id number] co-insurance:   Deductible: $0 of $500 met, does not apply  Prior Auth: APPROVED PA# [id number] Valid: 04/29/22-04/30/23    ** This summary of benefits is an estimation of the patient's out-of-pocket cost. Exact cost may very based on individual plan coverage.

## 2022-04-30 NOTE — Telephone Encounter (Signed)
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 Plan Follows UHC Guidelines. GELSYN-3 [id number] and administration 20610/20611 are covered at 85% of allowable amount. Deductible must be met before coverage applies. Specialist office visits if billed are covered at 100% of the allowable after a $30 copay. If out of pocket is met, coverage goes to 100% and copay will no longer apply.  No pre-cert and referrals needed. Medical notes must be submitted with the claim. Include medical necessity, diagnosis, and all clinicals.  Provider Bryan Lemma [facility] is in network with [employer] using NPI: [id number] Reference [phone] / [phone]

## 2022-05-01 NOTE — Telephone Encounter (Signed)
Will proceed with Zilretta injection if pt is agreeable.

## 2022-05-01 NOTE — Telephone Encounter (Signed)
Appointment scheduled on 2/29.

## 2022-05-01 NOTE — Telephone Encounter (Signed)
Tried to call patient. Call was sent to text and told patient would call back later.

## 2022-05-16 ENCOUNTER — Ambulatory Visit: Payer: 59 | Admitting: Family Medicine

## 2022-05-16 ENCOUNTER — Ambulatory Visit: Payer: Self-pay

## 2022-05-16 DIAGNOSIS — M25561 Pain in right knee: Secondary | ICD-10-CM | POA: Diagnosis not present

## 2022-05-16 DIAGNOSIS — M1711 Unilateral primary osteoarthritis, right knee: Secondary | ICD-10-CM

## 2022-05-16 DIAGNOSIS — G8929 Other chronic pain: Secondary | ICD-10-CM | POA: Diagnosis not present

## 2022-05-16 MED ORDER — TRIAMCINOLONE ACETONIDE 32 MG IX SRER
32.0000 mg | Freq: Once | INTRA_ARTICULAR | Status: AC
  Administered 2022-05-16: 32 mg via INTRA_ARTICULAR

## 2022-05-16 NOTE — Patient Instructions (Signed)
Thank you for coming in today.   You received an injection today. Seek immediate medical attention if the joint becomes red, extremely painful, or is oozing fluid.   Check back as needed

## 2022-05-16 NOTE — Progress Notes (Signed)
Michelle Moss presents to clinic today for Zilretta injection right knee  Procedure: Real-time Ultrasound Guided Injection of right knee superior lateral patellar space Device: Philips Affiniti 50G Images permanently stored and available for review in PACS Verbal informed consent obtained.  Discussed risks and benefits of procedure. Warned about infection, bleeding, hyperglycemia damage to structures among others. Patient expresses understanding and agreement Time-out conducted.   Noted no overlying erythema, induration, or other signs of local infection.   Skin prepped in a sterile fashion.   Local anesthesia: Topical Ethyl chloride.   With sterile technique and under real time ultrasound guidance: Zilretta 32 mg injected into knee joint. Fluid seen entering the joint capsule.   Completed without difficulty   Advised to call if fevers/chills, erythema, induration, drainage, or persistent bleeding.   Images permanently stored and available for review in the ultrasound unit.  Impression: Technically successful ultrasound guided injection.  Lot number: [id number]  Return as needed

## 2022-05-17 NOTE — Telephone Encounter (Signed)
Pt received Zilretta inj for RIGHT knee OA on 05/16/22.   Can consider repeat inj on 08/09/22

## 2022-07-15 ENCOUNTER — Encounter: Payer: Self-pay | Admitting: Family Medicine

## 2022-07-17 NOTE — Telephone Encounter (Signed)
VOB initiated for Zilretta for RIGHT knee OA.  

## 2022-07-19 NOTE — Telephone Encounter (Signed)
Zilretta for RIGHT knee OA  Last Zilretta inj 05/16/22 Can consider repeat on or after 08/09/22  Primary Insurance: UHC POS Co-Pay: $30 Co-insurance ([id number]): 15% Co-Insurance ([id number]): 0% Deductible: $500 of $500 met  Prior Auth: NOT required

## 2022-07-23 NOTE — Telephone Encounter (Signed)
Patient would like to hold off on scheduling.

## 2022-08-15 ENCOUNTER — Ambulatory Visit
Admission: RE | Admit: 2022-08-15 | Discharge: 2022-08-15 | Disposition: A | Discharge status: Home / Self Care | Payer: 59 | Source: Ambulatory Visit | Attending: General Surgery | Admitting: General Surgery

## 2022-08-15 DIAGNOSIS — Z803 Family history of malignant neoplasm of breast: Secondary | ICD-10-CM

## 2022-08-15 DIAGNOSIS — Z1239 Encounter for other screening for malignant neoplasm of breast: Secondary | ICD-10-CM

## 2022-08-15 MED ORDER — GADOPICLENOL 0.5 MMOL/ML IV SOLN
7.0000 mL | Freq: Once | INTRAVENOUS | Status: AC | PRN
  Administered 2022-08-15: 7 mL via INTRAVENOUS

## 2022-09-04 ENCOUNTER — Other Ambulatory Visit: Payer: Self-pay

## 2022-09-04 ENCOUNTER — Ambulatory Visit: Payer: 59 | Admitting: Family Medicine

## 2022-09-04 VITALS — BP 146/92 | HR 73 | Ht 67.0 in | Wt 238.0 lb

## 2022-09-04 DIAGNOSIS — M1711 Unilateral primary osteoarthritis, right knee: Secondary | ICD-10-CM | POA: Diagnosis not present

## 2022-09-04 DIAGNOSIS — M25561 Pain in right knee: Secondary | ICD-10-CM

## 2022-09-04 DIAGNOSIS — G8929 Other chronic pain: Secondary | ICD-10-CM | POA: Diagnosis not present

## 2022-09-04 MED ORDER — TRIAMCINOLONE ACETONIDE 32 MG IX SRER
32.0000 mg | Freq: Once | INTRA_ARTICULAR | Status: AC
  Administered 2022-09-04: 32 mg via INTRA_ARTICULAR

## 2022-09-04 NOTE — Patient Instructions (Addendum)
Thank you for coming in today.   You received an injection today. Seek immediate medical attention if the joint becomes red, extremely painful, or is oozing fluid.   Have a FABULOUS time on your cruise!!'  As you approach September let me know about knee replacement consultation referral.   Dr August Saucer, Dr Magnus Ivan Dr Roda Shutters at [facility].

## 2022-09-04 NOTE — Progress Notes (Signed)
Rubin Payor, PhD, LAT, ATC acting as a scribe for Clementeen Graham, MD.  Michelle Moss is a 52 y.o. female who presents to [facility] today for cont'd R knee pain. Pt was last seen by Dr. Denyse Amass on 05/16/22 and was given a R knee Zilretta injection. Her last R knee steroid injection was on 03/06/22 w/ Dr. Jean Rosenthal.  Today, pt reports R knee Zilretta injection was moderately helpful.  l. She is going on a cruise next week and would really like some type of pain relief for her R knee. She will be leaving town on Saturday.  Dx imaging: 10/16/21 R knee XR 02/17/19 R knee MRI             11/06/18 R knee XR  Pertinent review of systems: No fevers or chills  Relevant historical information: Knee DJD   Exam:  BP (!) 146/92   Pulse 73   Ht 5\' 7"  (1.702 m)   Wt 238 lb (108 kg)   SpO2 98%   BMI 37.28 kg/m  General: Well Developed, well nourished, and in no acute distress.   MSK: Right knee mild effusion normal-appearing otherwise normal motion with crepitation.    Lab and Radiology Results  Zilretta injection right knee Procedure: Real-time Ultrasound Guided Injection of right knee joint superior lateral patellar space Device: Philips Affiniti 50G Images permanently stored and available for review in PACS Verbal informed consent obtained.  Discussed risks and benefits of procedure. Warned about infection, bleeding, hyperglycemia damage to structures among others. Patient expresses understanding and agreement Time-out conducted.   Noted no overlying erythema, induration, or other signs of local infection.   Skin prepped in a sterile fashion.   Local anesthesia: Topical Ethyl chloride.   With sterile technique and under real time ultrasound guidance: Zilretta 32 mg injected into knee joint. Fluid seen entering the joint capsule.   Completed without difficulty  medication.   Advised to call if fevers/chills, erythema, induration, drainage, or persistent  bleeding.   Images permanently stored and available for review in the ultrasound unit.  Impression: Technically successful ultrasound guided injection. Lot number: [id number]     Assessment and Plan: 52 y.o. female with right knee pain due to DJD.  She is failing typical conservative management options.  Plan for Zilretta injection which should help her upcoming trip and provide some longer relief.  However she is getting close to the point where consultation with orthopedic surgery for knee replacement is starting to make sense.  3 months from now would be mid September which would be an ideal time to consider surgery.  Anticipate referral to orthopedic surgery in a month or 2 to get ahead of the planning for knee replacement. I provided her with some orthopedic surgeons names that I think are particularly good.  Happy to place referral whenever she would like.  PDMP not reviewed this encounter. Orders Placed This Encounter  Procedures   [place] LIMITED JOINT SPACE STRUCTURES LOW RIGHT(NO LINKED CHARGES)    Order Specific Question:   Reason for Exam (SYMPTOM  OR DIAGNOSIS REQUIRED)    Answer:   right knee pain    Order Specific Question:   Preferred imaging location?    Answer:   Adult nurse [facility] ordered this encounter  Medications   Triamcinolone Acetonide (ZILRETTA) intra-articular injection 32 mg     Discussed warning signs or symptoms. Please see discharge instructions. Patient expresses understanding.   The above  documentation has been reviewed and is accurate and complete Lynne Leader, M.D.

## 2022-09-16 NOTE — Telephone Encounter (Signed)
Pt received Zilretta inj for RIGHT knee OA on 09/04/22.  Can consider repeat inj on or after 11/28/22.

## 2022-09-18 NOTE — Telephone Encounter (Deleted)
VOB initiated for Zilretta for BILAT knee OA 

## 2022-10-10 ENCOUNTER — Ambulatory Visit: Payer: 59 | Admitting: Orthopaedic Surgery

## 2022-10-10 VITALS — Ht 67.0 in | Wt 238.0 lb

## 2022-10-10 DIAGNOSIS — M1711 Unilateral primary osteoarthritis, right knee: Secondary | ICD-10-CM | POA: Diagnosis not present

## 2022-10-10 DIAGNOSIS — M25561 Pain in right knee: Secondary | ICD-10-CM

## 2022-10-10 DIAGNOSIS — G8929 Other chronic pain: Secondary | ICD-10-CM

## 2022-10-10 NOTE — Progress Notes (Signed)
The patient is a 52 year old active female that I am seeing for the first time and she is sent from Dr. Clementeen Graham to evaluate and treat known arthritis of her right knee.  In 2020 she had a MRI of her right knee showing a complex medial meniscal tear of the posterior horn but only thinning of the cartilage on the medial aspect of her knee and no full-thickness cartilage deficit.  Over time she has worked on activity modification and strengthening her knees.  She has had steroid injections and hyaluronic acid injections and is gotten to where those things are not helping her.  She has a BMI of 37.  She is not diabetic.  She does have a family medical history of sisters having knee replacements as well.  Most of her pain is at rest but he does occur with activities as well.  There is no locking catching with her knee at all.  I was able to review all of her medications and medicines as well as past medical history within epic.  She is very pleasant 52 year old female.  Her right knee shows slight varus malalignment that is correctable.  There is no significant patellofemoral crepitation and all her pain is along the medial joint line of the knee.  There is no lateral joint line pain.  I did go over her previous MRI with her as well as plain films.  I do feel it is reasonable to obtain a new MRI of her right knee to assess the cartilage as well as the ACL and meniscus given her worsening symptoms because this could help delineate whether she would benefit from a partial knee replacement versus a total knee replacement and I explained this to her as well and she agrees with this looking into this.  I do feel that she is going need some type of arthroplasty based on what we are seeing on clinical exam and previous x-ray findings.  Also her clinical exam and failure conservative treatment points to this as well.  All questions and concerns were answered and addressed.  Will see her back after we have a MRI of her  right knee.

## 2022-10-11 ENCOUNTER — Other Ambulatory Visit: Payer: Self-pay

## 2022-10-11 DIAGNOSIS — G8929 Other chronic pain: Secondary | ICD-10-CM

## 2022-10-11 DIAGNOSIS — M1711 Unilateral primary osteoarthritis, right knee: Secondary | ICD-10-CM

## 2022-10-24 ENCOUNTER — Ambulatory Visit
Admission: RE | Admit: 2022-10-24 | Discharge: 2022-10-24 | Disposition: A | Discharge status: Home / Self Care | Payer: 59 | Source: Ambulatory Visit | Attending: Orthopaedic Surgery | Admitting: Orthopaedic Surgery

## 2022-10-24 DIAGNOSIS — G8929 Other chronic pain: Secondary | ICD-10-CM

## 2022-10-24 DIAGNOSIS — M1711 Unilateral primary osteoarthritis, right knee: Secondary | ICD-10-CM

## 2022-10-27 ENCOUNTER — Other Ambulatory Visit: Payer: 59

## 2022-10-29 ENCOUNTER — Ambulatory Visit: Payer: 59 | Admitting: Orthopaedic Surgery

## 2022-11-04 ENCOUNTER — Ambulatory Visit: Payer: 59 | Admitting: Orthopaedic Surgery

## 2022-11-04 ENCOUNTER — Encounter: Payer: Self-pay | Admitting: Orthopaedic Surgery

## 2022-11-04 DIAGNOSIS — M1711 Unilateral primary osteoarthritis, right knee: Secondary | ICD-10-CM | POA: Diagnosis not present

## 2022-11-04 DIAGNOSIS — M25561 Pain in right knee: Secondary | ICD-10-CM

## 2022-11-04 DIAGNOSIS — G8929 Other chronic pain: Secondary | ICD-10-CM

## 2022-11-04 NOTE — Progress Notes (Signed)
The patient is a very active 52 year old female well-known to me.  She is coming in today to go over MRI of her right knee.  She has sisters who have had knee replacement surgery and we are trying to determine whether or not she is a candidate for a partial knee replacement versus total knee replacement.  She has been on a weight loss journey as well.  Her BMI is down to 37.  She does not have big knees at all.  She does like to run as part of her exercise routine.  She has had arthroscopic surgery in the past and has had injections in her knee as well.  They last only a short amount of time.  She is not a diabetic.  She does not smoke.  She is very healthy otherwise.  She is continuing on her weight loss journey.  Examination of her right knee she has only medial joint line tenderness.  She has varus malalignment that is easily correctable.  She does not have a flexion contracture.  Her ligamentous exam is normal and the knee is stable.  There is no patellofemoral pain and no significant patellofemoral crepitation at all.  The MRI of her right knee does show full-thickness cartilage loss of the medial compartment of her knee with subchondral reactive edema.  The ACL and PCL are intact.  The lateral compartment is normal and intact.  There is some cartilage thinning of the medial facet of the patella.  I would like to send her to a colleague of mine Dr. Teryl Lucy with [facility] to see if she is a candidate for partial knee replacement surgery as well as to give her a good understanding of the survivability of a partial knee replacement versus total knee replacement.  She would like to have that referral as well as she is considering what is the best option for her.  We will work on making that happen.

## 2022-11-05 ENCOUNTER — Other Ambulatory Visit: Payer: Self-pay

## 2022-11-05 DIAGNOSIS — G8929 Other chronic pain: Secondary | ICD-10-CM

## 2022-11-05 DIAGNOSIS — M1711 Unilateral primary osteoarthritis, right knee: Secondary | ICD-10-CM

## 2022-11-19 NOTE — Telephone Encounter (Signed)
VOB initiated for ZILRETTA for RIGHT knee OA.

## 2022-11-20 NOTE — Telephone Encounter (Signed)
NO Prior Auth required for buy and bill

## 2022-11-20 NOTE — Telephone Encounter (Addendum)
Zilretta for RIGHT knee OA OK to schedule on or after 11/28/22   Primary Insurance: UHC POS Co-Pay: $30 Co-insurance ([id number]): 15% Co-Insurance ([id number]): 0% Deductible: $0 of $500 met - must be met for coverage to apply for CPT 20611   Prior Auth: NOT required    Knee Injection History: 10/16/21 - Kenalog RIGHT 03/06/22 - Kenalog RIGHT 05/16/22 - Zilretta RIGHT 09/04/22 - Zilretta RIGHT

## 2022-11-21 NOTE — Telephone Encounter (Signed)
Hold until needed

## 2023-03-18 NOTE — Telephone Encounter (Signed)
Will need to re-run benefits for 2025 on or after 03/19/23.

## 2023-04-07 NOTE — Telephone Encounter (Signed)
VOB initiated for ZILRETTA for RIGHT knee OA.

## 2023-04-08 NOTE — Telephone Encounter (Signed)
Prior Authorization NOT required for [employer] and [address]

## 2023-04-08 NOTE — Telephone Encounter (Signed)
ZILRETTA for RIGHT knee OA   Medical Buy and Bill  Primary Insurance: UHC POS [employer] Co-pay: $30 Co-insurance: 15% Deductible: $0 of $500 met - must be met for coverage to apply Prior Auth: NOT required   Knee Injection History 10/16/21 - Kenalog RIGHT 03/06/22 - Kenalog RIGHT 05/16/22 - Zilretta RIGHT 09/04/22 - Zilretta RIGHT

## 2023-06-23 ENCOUNTER — Ambulatory Visit
Admission: RE | Admit: 2023-06-23 | Discharge: 2023-06-23 | Disposition: A | Discharge status: Home / Self Care | Source: Ambulatory Visit | Attending: Family Medicine | Admitting: Family Medicine

## 2023-06-23 DIAGNOSIS — N760 Acute vaginitis: Secondary | ICD-10-CM | POA: Diagnosis present

## 2023-06-23 NOTE — ED Provider Notes (Signed)
 [facility]  Note:  This document was prepared using Conservation officer, historic buildings and may include unintentional dictation errors.  MRN: [id number] DOB: [birth year]  Subjective:   Michelle Moss is a 53 y.o. female presenting for 2-3 day history of acute onset vaginal discharge, vaginal itching. Has a history of BV and yeast infection. No urinary symptoms.  No fever, nausea, vomiting, abdominal or pelvic pain.  No genital rashes to her knowledge.  No current facility-administered medications for this encounter.  Current Outpatient Medications:    eletriptan (RELPAX) 20 MG tablet, Take 20 mg by mouth as needed for migraine or headache. May repeat in 2 hours if headache persists or recurs., Disp: , Rfl:    ergocalciferol (VITAMIN D2) 1.25 MG (50000 UT) capsule, Take 50,000 Units by mouth daily., Disp: , Rfl:    Multiple Vitamin (MULTIVITAMIN) tablet, Take 1 tablet by mouth daily., Disp: , Rfl:    Omega-3 Fatty Acids (FISH OIL PO), Take by mouth., Disp: , Rfl:    vitamin C (ASCORBIC ACID) 500 MG tablet, Take 500 mg by mouth daily., Disp: , Rfl:    Zinc Sulfate (ZINC-220 PO), Take by mouth., Disp: , Rfl:    Allergies  Allergen Reactions   No Known Allergies     Past Medical History:  Diagnosis Date   Anemia    Fibrocystic breast    Sleep apnea    in past-no issues     Past Surgical History:  Procedure Laterality Date   BREAST LUMPECTOMY  2011   BTL  2001   ROTATOR CUFF REPAIR  2011   TONSILECTOMY/ADENOIDECTOMY WITH MYRINGOTOMY  1995   TUBAL LIGATION     WRIST SURGERY      Family History  Problem Relation Age of Onset   Arthritis Mother    Colon cancer Father    Diabetes Father    Heart disease Father    Colon polyps Neg Hx    Esophageal cancer Neg Hx    Stomach cancer Neg Hx    Rectal cancer Neg Hx     Social History   Tobacco Use   Smoking status: Never   Smokeless tobacco: Never  Vaping Use   Vaping status: Never Used  Substance  Use Topics   Alcohol use: Not Currently    Comment: social   Drug use: No    ROS   Objective:   Vitals: BP (!) (P) 142/89 (BP Location: Right Arm)   Pulse (P) 68   Temp (P) 98.3 F (36.8 C) (Oral)   Resp (P) 16   Physical Exam Constitutional:      General: She is not in acute distress.    Appearance: Normal appearance. She is well-developed. She is not ill-appearing, toxic-appearing or diaphoretic.  HENT:     Head: Normocephalic and atraumatic.     Nose: Nose normal.     Mouth/Throat:     Mouth: Mucous membranes are moist.  Eyes:     General: No scleral icterus.       Right eye: No discharge.        Left eye: No discharge.     Extraocular Movements: Extraocular movements intact.     Conjunctiva/sclera: Conjunctivae normal.  Cardiovascular:     Rate and Rhythm: Normal rate.  Pulmonary:     Effort: Pulmonary effort is normal.  Abdominal:     General: Bowel sounds are normal. There is no distension.     Palpations: Abdomen is soft. There is  no mass.     Tenderness: There is no abdominal tenderness. There is no right CVA tenderness, left CVA tenderness, guarding or rebound.  Skin:    General: Skin is warm and dry.  Neurological:     General: No focal deficit present.     Mental Status: She is alert and oriented to person, place, and time.  Psychiatric:        Mood and Affect: Mood normal.        Behavior: Behavior normal.        Thought Content: Thought content normal.        Judgment: Judgment normal.     Assessment and Plan :   PDMP not reviewed this encounter.  1. Acute vaginitis    Patient prefers to treat upper results.  Will update her tomorrow.   Wallis Bamberg, [place] 06/23/23 1810

## 2023-06-23 NOTE — ED Triage Notes (Signed)
 Pt c/o vaginal d/c and itching x 2-3 days-NAD-steady gait

## 2023-06-24 ENCOUNTER — Telehealth (HOSPITAL_COMMUNITY): Payer: Self-pay

## 2023-06-24 LAB — CERVICOVAGINAL ANCILLARY ONLY
Bacterial Vaginitis (gardnerella): NEGATIVE
Candida Glabrata: NEGATIVE
Candida Vaginitis: NEGATIVE
Chlamydia: NEGATIVE
Comment: NEGATIVE
Comment: NEGATIVE
Comment: NEGATIVE
Comment: NEGATIVE
Comment: NEGATIVE
Comment: NORMAL
Neisseria Gonorrhea: NEGATIVE
Trichomonas: POSITIVE — AB

## 2023-06-24 MED ORDER — METRONIDAZOLE 500 MG PO TABS: 500.0000 mg | ORAL_TABLET | Freq: Two times a day (BID) | ORAL | 0 refills | Status: AC

## 2023-06-24 NOTE — Telephone Encounter (Signed)
 Per protocol, pt requires tx with metronidazole. Attempted to reach patient x1. LVM.  Rx sent to pharmacy on file.

## 2023-08-25 ENCOUNTER — Other Ambulatory Visit: Payer: Self-pay | Admitting: General Surgery

## 2023-08-25 DIAGNOSIS — Z9189 Other specified personal risk factors, not elsewhere classified: Secondary | ICD-10-CM

## 2023-09-11 ENCOUNTER — Other Ambulatory Visit: Payer: Self-pay | Admitting: General Surgery

## 2023-09-11 DIAGNOSIS — Z9189 Other specified personal risk factors, not elsewhere classified: Secondary | ICD-10-CM

## 2023-12-09 ENCOUNTER — Other Ambulatory Visit: Payer: Self-pay | Admitting: Family Medicine

## 2023-12-09 DIAGNOSIS — M25532 Pain in left wrist: Secondary | ICD-10-CM

## 2023-12-09 NOTE — Progress Notes (Signed)
 Several week h/o L wrist pain. No injury. Located primarly along the Ulnar side w/ occasional involvement of the radial side. Of note there is no pain on the dorsal side, just tthe volar side.  Xray ordered.

## 2024-02-11 ENCOUNTER — Ambulatory Visit
Admission: RE | Admit: 2024-02-11 | Discharge: 2024-02-11 | Disposition: A | Source: Ambulatory Visit | Attending: Family Medicine | Admitting: Family Medicine

## 2024-02-11 DIAGNOSIS — M25532 Pain in left wrist: Secondary | ICD-10-CM

## 2024-02-27 ENCOUNTER — Other Ambulatory Visit: Payer: Self-pay

## 2024-03-01 LAB — SURGICAL PATHOLOGY

## 2024-03-19 ENCOUNTER — Other Ambulatory Visit: Payer: Self-pay | Admitting: General Surgery

## 2024-03-19 DIAGNOSIS — Z170 Estrogen receptor positive status [ER+]: Secondary | ICD-10-CM

## 2024-03-19 NOTE — Progress Notes (Signed)
 "  PROVIDER:  DONNICE CARLIN BURY, MD  MRN: [id number] DOB: Sep 17, 1970 DATE OF ENCOUNTER: 03/19/2024 Subjective     Chief Complaint: New Consultation     History of Present Illness: This is a 54 year old female I saw in 2023 for high risk.  She was calculated to have a TC lifetime risk of 24%.  We discussed following her with staggered mammograms and MRI.  She had an MRI in December 2024 that was negative.  Her next imaging was then a mammogram that she had in November 2025.  This showed a 7 mm mass in the right breast.  Ultrasound showed no abnormal lymph nodes and a 6 mm mass in the right breast at 3:00 posterior depth.  She has no mass or discharge that she notes at all.  This underwent a biopsy.  The biopsy shows a grade 2 invasive ductal carcinoma that is 90% ER positive, 10% PR positive, HER2 negative and the Ki-67 is 10%.  She is here to  Review of Systems: A complete review of systems was obtained from the patient.  I have reviewed this information and discussed as appropriate with the patient.  See HPI as well for other ROS.  Review of Systems  All other systems reviewed and are negative.     Medical History: Past Medical History:  Diagnosis Date   Sleep apnea     Patient Active Problem List  Diagnosis   Malignant neoplasm of upper-inner quadrant of right breast in female, estrogen receptor positive (CMS/HHS-HCC)    Past Surgical History:  Procedure Laterality Date   Left shoulder surgery     right hand surgery     Right shoulder surgery     TONSILLECTOMY     tubal ligation       No Known Allergies  Current Outpatient Medications on File Prior to Visit  Medication Sig Dispense Refill   cholecalciferol (VITAMIN D3) 1,250 mcg (50,000 unit) capsule TAKE 1 CAPSULE WITH FOOD EVERY 3 DAYS FOR 8 CAPSULES.     eletriptan (RELPAX) 40 MG tablet TAKE 1 TABLET BY MOUTH AT ONSET OF HEADACHE, MAY REPEAT DOSE ONCE IN 2 HOURS X 1 IN 24 HOURS     FLUoxetine  (PROZAC) 10 MG tablet Take 10 mg by mouth once daily     meloxicam (MOBIC) 15 MG tablet take 1 tablet orally daily as needed     omega-3 fatty acids-fish oil 300-1,000 mg capsule Take by mouth     ascorbic acid, vitamin C, (VITAMIN C) 500 MG tablet Take by mouth     ergocalciferol , vitamin D2, 1,250 mcg (50,000 unit) capsule Take by mouth     multivitamin tablet Take 1 tablet by mouth once daily     zinc sulfate 50 mg zinc (220 mg) Tab Take by mouth     No current facility-administered medications on file prior to visit.    Family History  Problem Relation Age of Onset   Stroke Mother    High blood pressure (Hypertension) Father    Hyperlipidemia (Elevated cholesterol) Father    Coronary Artery Disease (Blocked arteries around heart) Father    Diabetes Father    Colon cancer Father    Diabetes Sister    Breast cancer Sister      Social History   Tobacco Use  Smoking Status Never  Smokeless Tobacco Never     Social History   Socioeconomic History   Marital status: Married  Tobacco Use   Smoking status: Never  Smokeless tobacco: Never  Substance and Sexual Activity   Alcohol use: Yes    Comment: socially   Drug use: Never   Social Drivers of Health   Housing Stability: Unknown (03/19/2024)   Housing Stability Vital Sign    Homeless in the Last Year: No    Objective:    Vitals:   03/19/24 1052  PainSc: 0-No pain    There is no height or weight on file to calculate BMI.  Physical Exam Vitals reviewed.  Constitutional:      Appearance: Normal appearance.  Chest:  Breasts:    Right: No inverted nipple, mass or nipple discharge.     Left: No inverted nipple, mass or nipple discharge.  Lymphadenopathy:     Upper Body:     Right upper body: No supraclavicular or axillary adenopathy.     Left upper body: No supraclavicular or axillary adenopathy.  Neurological:     Mental Status: She is alert.         Assessment and Plan:      Diagnoses and all orders for this visit:  Malignant neoplasm of upper-inner quadrant of right breast in female, estrogen receptor positive (CMS/HHS-HCC)  Right breast radioactive seed guided lumpectomy Possible right axillary sentinel lymph node biopsy pending medical and radiation oncology appointments  We discussed the staging and pathophysiology of breast cancer. We discussed all of the different options for treatment for breast cancer including surgery, chemotherapy, radiation therapy, and antiestrogen therapy  We discussed a sentinel lymph node biopsy as she does not appear to having lymph node involvement right now. We discussed the performance of that with injection of tracer. We discussed that there is a chance of having a positive node with a sentinel lymph node biopsy and we will await the permanent pathology to make any other first further decisions in terms of her treatment. We discussed up to a 5% risk lifetime of chronic shoulder pain as well as lymphedema associated with a sentinel lymph node biopsy. We discussed INSEMA and SOUND data in relation to tumor and age. She is in early 41s but I think if med onc and rad onc agree with this tumor we can omit a node biopsy.  She would like to do that.   We discussed the options for treatment of the breast cancer which included lumpectomy versus a mastectomy. We discussed the performance of the lumpectomy with radioactive seed placement. We discussed a 5-10% chance of a positive margin requiring reexcision in the operating room. We also discussed that she will likely need radiation therapy if she undergoes lumpectomy.  We discussed mastectomy and the postoperative care for that as well. Mastectomy can be followed by reconstruction. The decision for lumpectomy vs mastectomy has no impact on decision for chemotherapy. Most mastectomy patients will not need radiation therapy. We discussed that there is no difference in her survival whether she  undergoes lumpectomy with radiation therapy or antiestrogen therapy versus a mastectomy. There is also no real difference between her recurrence in the breast.  We discussed the risks of operation including bleeding, infection, possible reoperation. She understands her further therapy will be based on what her stages at the time of her operation.     MATTHEW CARLIN BURY, MD     "

## 2024-03-22 ENCOUNTER — Encounter: Payer: Self-pay | Admitting: *Deleted

## 2024-03-22 DIAGNOSIS — C50411 Malignant neoplasm of upper-outer quadrant of right female breast: Secondary | ICD-10-CM | POA: Insufficient documentation

## 2024-03-23 ENCOUNTER — Inpatient Hospital Stay: Attending: Hematology and Oncology | Admitting: Hematology and Oncology

## 2024-03-23 ENCOUNTER — Encounter: Payer: Self-pay | Admitting: Hematology and Oncology

## 2024-03-23 ENCOUNTER — Inpatient Hospital Stay

## 2024-03-23 VITALS — BP 122/64 | HR 66 | Temp 97.6°F | Resp 17 | Wt 248.9 lb

## 2024-03-23 DIAGNOSIS — Z170 Estrogen receptor positive status [ER+]: Secondary | ICD-10-CM

## 2024-03-23 DIAGNOSIS — C50411 Malignant neoplasm of upper-outer quadrant of right female breast: Secondary | ICD-10-CM

## 2024-03-23 NOTE — Progress Notes (Unsigned)
 [facility] CONSULT NOTE  Patient Care Team: Remonia Alm PARAS, MD as PCP - General (Family Medicine) Naomi Hitch, MD (Internal Medicine) Remonia Alm PARAS, MD as Consulting Physician (Family Medicine) Gerome Devere HERO, RN as Oncology Nurse Navigator  CHIEF COMPLAINTS/PURPOSE OF CONSULTATION:  New diagnosis of right sided breast cancer  ASSESSMENT & PLAN:  Assessment & Plan Right breast invasive ductal carcinoma, ER+, PR+, HER2- Newly diagnosed small right breast invasive ductal carcinoma, ER+, PR+, HER2-. Intermediate grade with favorable prognosis.  Family history of breast cancer.Previous genetic testing neg. - Scheduled lumpectomy for April 14, 2024. - Remeasure tumor size post-excision and send for additional testing as indicated. - If tumor >5 mm at surgery, order Oncotype DX testing to assess recurrence risk and chemotherapy benefit. - Present case at multidisciplinary conference to discuss sentinel lymph node biopsy. - Refer to radiation oncology for adjuvant radiation therapy post-surgery. - Plan for 5 years of adjuvant endocrine therapy post-radiation, considering aromatase inhibitor versus tamoxifen  - Follow up in February (2-3 weeks post-op) to review pathology and Oncotype results and finalize adjuvant therapy plan.  Type 2 diabetes mellitus Recently diagnosed, managed with metformin. - Reviewed diabetes diagnosis and current metformin therapy. - Requested most recent HbA1c value for review.  Postmenopausal state Postmenopausal for three years. Anticipated exacerbation of menopausal symptoms and impact on bone density with endocrine therapy. - Discussed need for bone density monitoring during endocrine therapy due to increased osteoporosis risk. - Reviewed risks and benefits of endocrine therapy, including minimal cardiovascular impact and importance of optimizing overall health.    HISTORY OF PRESENTING ILLNESS:  Michelle Moss 54 y.o. female is  here because of new diagnosis of breast cancer  Discussed the use of AI scribe software for clinical note transcription with the patient, who gave verbal consent to proceed.  History of Present Illness Michelle Moss is a 54 year old woman with newly diagnosed right breast invasive ductal carcinoma who presents for oncology consultation and treatment planning.  She was identified as high risk for breast cancer and underwent annual screening with mammography and breast MRI. A recent mammogram detected a 6-7 mm lesion in the right breast at the 3 o'clock position, confirmed by biopsy as invasive ductal carcinoma, ER positive, PR positive (10%), and HER2 negative. She is scheduled for lumpectomy on April 14, 2024.  She recently discontinued progesterone-only hormone replacement therapy, which she had used for several months for menopausal symptoms. She is unsure if she received estrogen as well. While on hormone therapy, she experienced improved sleep, reduced night sweats, and less emotional lability. She is postmenopausal, with her last menstrual period approximately three years ago, and continues to experience joint stiffness and vaginal dryness.  She has a significant family history of breast cancer in two sisters, one diagnosed in her late thirties/early forties and the other in her early fifties. She previously underwent BRCA1 and BRCA2 genetic testing over a decade ago, reportedly negative, with documentation pending. She has a remote history of left breast surgery for benign lump removal, with details unclear.  She was recently diagnosed with type 2 diabetes and started on metformin. 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 She has two adult children and did not breastfeed. She expressed concerns regarding the impact of future endocrine therapy on menopausal symptoms and bone health, and her preference to minimize surgical interventions if  possible.  All other systems were reviewed with the patient and are negative.  MEDICAL HISTORY:  Past Medical History:  Diagnosis Date   Anemia    Fibrocystic breast    Sleep apnea    in past-no issues    SURGICAL HISTORY: Past Surgical History:  Procedure Laterality Date   BREAST LUMPECTOMY  2011   BTL  2001   KNEE SURGERY     per pt   ROTATOR CUFF REPAIR  2011   TONSILECTOMY/ADENOIDECTOMY WITH MYRINGOTOMY  1995   TUBAL LIGATION     WRIST SURGERY      SOCIAL HISTORY: Social History   Socioeconomic History   Marital status: Married    Spouse name: Not on file   Number of children: Not on file   Years of education: Not on file   Highest education level: Not on file  Occupational History   Not on file  Tobacco Use   Smoking status: Never   Smokeless tobacco: Never  Vaping Use   Vaping status: Never Used  Substance and Sexual Activity   Alcohol use: Yes    Comment: social   Drug use: No   Sexual activity: Yes    Birth control/protection: Surgical    Comment: BTL  Other Topics Concern   Not on file  Social History Narrative   Not on file   Social Drivers of Health   Tobacco Use: Low Risk  (03/19/2024)   Received from [facility]   Patient History    Smoking Tobacco Use: Never    Smokeless Tobacco Use: Never    Passive Exposure: Not on file  Financial Resource Strain: Not on file  Food Insecurity: No Food Insecurity (03/22/2024)   Epic    Worried About Programme Researcher, Broadcasting/film/video in the Last Year: Never true    Ran Out of Food in the Last Year: Never true  Transportation Needs: No Transportation Needs (03/22/2024)   Epic    Lack of Transportation (Medical): No    Lack of Transportation (Non-Medical): No  Physical Activity: Not on file  Stress: Not on file  Social Connections: Not on file  Intimate Partner Violence: Not on file  Depression (PHQ2-9): Low Risk (03/23/2024)   Depression (PHQ2-9)    PHQ-2 Score: 0  Alcohol Screen: Not on file   Housing: Low Risk (03/22/2024)   Epic    Unable to Pay for Housing in the Last Year: No    Number of Times Moved in the Last Year: 0    Homeless in the Last Year: No  Utilities: Not At Risk (03/22/2024)   Epic    Threatened with loss of utilities: No  Health Literacy: Not on file    FAMILY HISTORY: Family History  Problem Relation Age of Onset   Arthritis Mother    Colon cancer Father    Diabetes Father    Heart disease Father    Colon polyps Neg Hx    Esophageal cancer Neg Hx    Stomach cancer Neg Hx    Rectal cancer Neg Hx     ALLERGIES:  has no known allergies.  MEDICATIONS:  Current Outpatient Medications  Medication Sig Dispense Refill   eletriptan (RELPAX) 20 MG tablet Take 20 mg by mouth as needed for migraine or headache. May repeat in 2 hours if headache persists or recurs.     ergocalciferol  (VITAMIN D2) 1.25 MG (50000 UT) capsule Take 50,000 Units by mouth daily.     Multiple Vitamin (MULTIVITAMIN) tablet Take 1 tablet by mouth daily.     Omega-3 Fatty  Acids (FISH OIL PO) Take by mouth.     vitamin C (ASCORBIC ACID) 500 MG tablet Take 500 mg by mouth daily.     Zinc Sulfate (ZINC-220 PO) Take by mouth.     No current facility-administered medications for this visit.     PHYSICAL EXAMINATION: ECOG PERFORMANCE STATUS: 0 - Asymptomatic  Vitals:   03/23/24 1429  BP: 122/64  Pulse: 66  Resp: 17  Temp: 97.6 F (36.4 C)  SpO2: 96%   Filed Weights   03/23/24 1429  Weight: 248 lb 14.4 oz (112.9 kg)    GENERAL:alert, no distress and comfortable No def palpable breast mass. No regional adenopathy CTA bilaterally, RRR No LE edema.  LABORATORY DATA:  I have reviewed the data as listed Lab Results  Component Value Date   WBC 8.9 05/07/2012   HGB 13.8 05/07/2012   HCT 41.3 05/07/2012   MCV 86.0 05/07/2012   PLT 347 05/07/2012     Chemistry   No results found for: NA, K, CL, CO2, BUN, CREATININE, GLU No results found for: CALCIUM,  ALKPHOS, AST, ALT, BILITOT     RADIOGRAPHIC STUDIES: I have personally reviewed the radiological images as listed and agreed with the findings in the report. No results found.  All questions were answered. The patient knows to call the clinic with any problems, questions or concerns. I spent 45 minutes in the care of this patient including H and P, review of records, counseling and coordination of care.     Amber Stalls, MD 03/23/2024 2:34 PM

## 2024-03-24 ENCOUNTER — Encounter: Payer: Self-pay | Admitting: Genetic Counselor

## 2024-03-25 ENCOUNTER — Encounter: Payer: Self-pay | Admitting: *Deleted

## 2024-03-25 DIAGNOSIS — Z170 Estrogen receptor positive status [ER+]: Secondary | ICD-10-CM

## 2024-03-25 NOTE — Progress Notes (Signed)
 Met patient 1/6 at initial med onc appt with Dr. Loretha. Introduced role of navigation. Breast cancer information given to patient along with Journey notebook. She has navigator's contact information. Patient working and states feeling overwhelmed with diagnosis. Tearful off and on during appt. Social work consult placed.    After appt did email navigator her prior genetic testing and this was forwarded to Dr. Loretha and Santana in genetics. No genetics to be done at this time. Oncotype to be done if tumor > 5mm. F/u with Dr. Loretha on 2/10. Surgery scheduled on 1/20. Seeing Rad Onc on 1/9.

## 2024-03-25 NOTE — Progress Notes (Addendum)
 " Radiation Oncology         [phone] ________________________________  Initial Outpatient Consultation  Name: Michelle Moss MRN: [id number]  Date: 03/26/2024  DOB: 16-Jan-1971  RR:Fzmmzoo, Alm PARAS, MD  Ebbie Cough, MD   REFERRING PHYSICIAN: Ebbie Cough, MD  DIAGNOSIS:    ICD-10-CM   1. Carcinoma of upper-inner quadrant of right breast in female, estrogen receptor positive (HCC)  C50.211    Z17.0        Cancer Staging  Malignant neoplasm of upper-outer quadrant of right breast in female, estrogen receptor positive (HCC) Staging form: Breast, AJCC 8th Edition - Clinical: Stage IA (cT1b, cN0, cM0, G2, ER+, PR+, HER2-) - Signed by Loretha Ash, MD on 03/24/2024 Histologic grading system: 3 grade system   Stage IA (cT1b, cN0, cM0) Right Breast UIQ (Medial/3 o'clock position), Invasive Ductal Carcinoma, ER+ / PR+ / Her2-, Grade 2  CHIEF COMPLAINT: Here to discuss management of right breast cancer  HISTORY OF PRESENT ILLNESS::Michelle Moss is a 54 y.o. female who established care with Dr. Ebbie in 2023 due to a high lifetime risk of breast cancer with a TC lifetime risk score of 24%. Testing was prompted due to her strong family history of breast cancer in two of her sisters. In this setting, she has been followed by Dr. Ebbie with routine mammograms and high risk screening breast MRI's.   Although many of her imaging reports are unavailable for review at this time, provider notes indicate that she had a bilateral breast MRI in December of 2024 that showed no evidence of malignancy in either breast. However, her next imaging study, that consisted of a bilateral diagnostic mammogram in November of 2025, showed a 7 mm mass in the right breast. An ultrasound of the right breast was then performed which demonstrated a 6 mm mass in the 3 o'clock right breast, located at a posterior depth, which correlated with mammographic findings.  Although I do not have access to the  ultrasound report, according to documentation by Dr. Ebbie the ultrasound showed no abnormal lymph nodes  Biopsy of the 3 o'clock right breast mass on the date of 02/27/24 showed grade 2 invasive ductal carcinoma measuring 0.5 cm in the greatest linear extent of the sample; negative for LVI.  ER status: 90% positive and PR status 10% positive, both with moderate to strong staining intensity; Proliferation marker Ki67 at 10%; Her2 status negative; Grade 2. No lymph nodes were examined.   She was accordingly referred to general surgery and was seen in consultation by Dr. Ebbie on 03/20/23 to discuss treatment options. Based on her discussion with Dr. Ebbie, she has opted to pursue breast conserving surgery. She is leaning towards omitting SLN biopsies at this time (SLN excisions may be pursued pending final surgical pathology).  Her procedure has been scheduled for 04/06/24.   She was recently seen in consultation by Dr. Loretha on 03/24/23. Dr. Loretha would like to have her case presented at the multidisciplinary conference to further discuss SLN biopsies. Oncotype testing will also be pursued if her final path shows a tumor size of greater than 5 mm. With regards to hormonal therapy, Dr. Loretha has recommended adjuvant endocrine therapy w/ AI vs tamoxifen after she completes radiation therapy. The patient will follow-up with Dr. Loretha in mid-February to review her pathology results and to further discuss adjuvant treatment options.   Of note: She is postmenopausal and recently discontinued progesterone-only hormone replacement therapy which she had used for menopausal symptom management. (It  appears that she was only on this for several months before discontinuing it).   Of additional note: She previously genetic testing almost 10 years ago  Variant of unknown significance was identified of the MUTYH gene.  She also has a remote history of left breast surgery for removal of a benign mass.   She  works in an office.  She is accompanied by her supportive significant other today.  She used to live in [place] .  PREVIOUS RADIATION THERAPY: No  PAST MEDICAL HISTORY:  has a past medical history of Anemia, Breast cancer (HCC), Fibrocystic breast, and Sleep apnea.    PAST SURGICAL HISTORY: Past Surgical History:  Procedure Laterality Date   BREAST LUMPECTOMY  2011   BTL  2001   KNEE SURGERY     per pt   ROTATOR CUFF REPAIR  2011   TONSILECTOMY/ADENOIDECTOMY WITH MYRINGOTOMY  1995   TUBAL LIGATION     WRIST SURGERY      FAMILY HISTORY: family history includes Arthritis in her mother; Breast cancer (age of onset: 82) in her sister; Breast cancer (age of onset: 12) in her sister; Colon cancer (age of onset: 109) in her father; Diabetes in her father; Heart disease in her father.  SOCIAL HISTORY:  reports that she has never smoked. She has never used smokeless tobacco. She reports current alcohol use. She reports that she does not use drugs.  ALLERGIES: Patient has no known allergies.  MEDICATIONS:  Current Outpatient Medications  Medication Sig Dispense Refill   eletriptan (RELPAX) 20 MG tablet Take 20 mg by mouth as needed for migraine or headache. May repeat in 2 hours if headache persists or recurs.     ergocalciferol  (VITAMIN D2) 1.25 MG (50000 UT) capsule Take 50,000 Units by mouth daily.     Multiple Vitamin (MULTIVITAMIN) tablet Take 1 tablet by mouth daily.     Omega-3 Fatty Acids (FISH OIL PO) Take by mouth.     vitamin C (ASCORBIC ACID) 500 MG tablet Take 500 mg by mouth daily.     Zinc Sulfate (ZINC-220 PO) Take by mouth.     No current facility-administered medications for this encounter.    REVIEW OF SYSTEMS: As above in HPI.   PHYSICAL EXAM:  height is 5' 7 (1.702 m) and weight is 247 lb 3.2 oz (112.1 kg). Her oral temperature is 98.1 F (36.7 C). Her blood pressure is 147/95 (abnormal) and her pulse is 66. Her respiration is 17 and oxygen saturation is 99%.    General: Alert and oriented, in no acute distress HEENT: Head is normocephalic. Extraocular movements are intact.  Heart: Regular in rate and rhythm with no murmurs, rubs, or gallops. Chest: Clear to auscultation bilaterally, with no rhonchi, wheezes, or rales. Abdomen: Soft, nontender, nondistended, with no rigidity or guarding. Extremities: No cyanosis or edema. Skin: No concerning lesions. Musculoskeletal: symmetric strength and muscle tone throughout. Neurologic: Cranial nerves II through XII are grossly intact. No obvious focalities. Speech is fluent. Coordination is intact. Psychiatric: Judgment and insight are intact. Affect is appropriate. Breasts: No palpable masses appreciated in the breasts or axillae bilaterally.    ECOG = 0  0 - Asymptomatic (Fully active, able to carry on all predisease activities without restriction)  1 - Symptomatic but completely ambulatory (Restricted in physically strenuous activity but ambulatory and able to carry out work of a light or sedentary nature. For example, light housework, office work)  2 - Symptomatic, <50% in bed during the day (Ambulatory and  capable of all self care but unable to carry out any work activities. Up and about more than 50% of waking hours)  3 - Symptomatic, >50% in bed, but not bedbound (Capable of only limited self-care, confined to bed or chair 50% or more of waking hours)  4 - Bedbound (Completely disabled. Cannot carry on any self-care. Totally confined to bed or chair)  5 - Death   Raylene MM, Creech RH, Tormey DC, et al. [phone]). Toxicity and response criteria of the [facility]. Am. DOROTHA Bridges. Oncol. 5 (6): 649-55   LABORATORY DATA:   CBC    Component Value Date/Time   WBC 8.9 05/07/2012 1101   RBC 4.80 05/07/2012 1101   HGB 13.8 05/07/2012 1101   HCT 41.3 05/07/2012 1101   PLT 347 05/07/2012 1101   MCV 86.0 05/07/2012 1101   MCH 28.8 05/07/2012 1101   MCHC 33.4 05/07/2012 1101    RDW 13.6 05/07/2012 1101    CMP  No results found for: NA, K, CL, CO2, GLUCOSE, BUN, CREATININE, CALCIUM, PROT, ALBUMIN, AST, ALT, ALKPHOS, BILITOT, GFR, EGFR, GFRNONAA  The patient provided documentation showing that her hemoglobin A1c was 6.7 on 01/08/2024 and her progesterone was less than 0.1 on that same date   RADIOGRAPHY: No results found.    IMPRESSION/PLAN:  Cancer Staging  Malignant neoplasm of upper-outer quadrant of right breast in female, estrogen receptor positive (HCC) Staging form: Breast, AJCC 8th Edition - Clinical: Stage IA (cT1b, cN0, cM0, G2, ER+, PR+, HER2-) - Signed by Iruku, Praveena, MD on 03/24/2024 Histologic grading system: 3 grade system  This is a very pleasant patient with a family history of breast cancer.  Genetic testing was notable for a variant of unknown significance of the MUTYH gene almost 10 years ago.  We did talk about the option of meeting with a genetic counselor and considering an updated genetic testing panel.  At this time she would like to defer that.  We also discussed her plan of deferring a sentinel lymph node biopsy.  We discussed that a sentinel lymph node biopsy is standard of care for her condition.  However she is concerned about potential side effects related to that procedure.  She is willing to undergo high tangent radiation to both the breast and the adjacent highest risk axillary nodes when she undergoes adjuvant therapy.  It was a pleasure meeting the patient today. We discussed the risks, benefits, and side effects of postlumpectomy radiotherapy. I recommend radiotherapy to the right breast and the adjacent axillary nodes with high tangents to reduce her risk of locoregional recurrence by 2/3.  We discussed that radiation would take approximately 4 weeks to complete and that I would give the patient a few weeks to heal following surgery before starting treatment planning.  If chemotherapy were to be  given, this would precede radiotherapy. We spoke about acute effects including skin irritation and fatigue as well as much less common late effects including internal organ injury or irritation. We spoke about the latest technology that is used to minimize the risk of late effects for patients undergoing radiotherapy to the breast or chest wall. No guarantees of treatment were given. The patient is enthusiastic about proceeding with treatment. I look forward to participating in the patient's care.  I will await her referral back to me for postoperative follow-up and eventual CT simulation/treatment planning.  On date of service, in total, I spent 50 minutes on this encounter. Patient was seen in person.  __________________________________________   Lauraine Golden, MD  This document serves as a record of services personally performed by Lauraine Golden, MD. It was created on her behalf by Dorthy Fuse, a trained medical scribe. The creation of this record is based on the scribe's personal observations and the provider's statements to them. This document has been checked and approved by the attending provider.  "

## 2024-03-26 ENCOUNTER — Ambulatory Visit
Admission: RE | Admit: 2024-03-26 | Discharge: 2024-03-26 | Disposition: A | Discharge status: Home / Self Care | Source: Ambulatory Visit | Attending: Hematology and Oncology | Admitting: Hematology and Oncology

## 2024-03-26 ENCOUNTER — Telehealth: Payer: Self-pay | Admitting: Radiation Oncology

## 2024-03-26 ENCOUNTER — Ambulatory Visit
Admission: RE | Admit: 2024-03-26 | Discharge: 2024-03-26 | Disposition: A | Discharge status: Home / Self Care | Source: Ambulatory Visit | Attending: Radiation Oncology | Admitting: Radiation Oncology

## 2024-03-26 ENCOUNTER — Ambulatory Visit
Admission: RE | Admit: 2024-03-26 | Discharge: 2024-03-26 | Disposition: A | Source: Ambulatory Visit | Attending: Radiation Oncology

## 2024-03-26 ENCOUNTER — Other Ambulatory Visit: Payer: Self-pay

## 2024-03-26 VITALS — BP 147/95 | HR 66 | Temp 98.1°F | Resp 17 | Ht 67.0 in | Wt 247.2 lb

## 2024-03-26 DIAGNOSIS — Z853 Personal history of malignant neoplasm of breast: Secondary | ICD-10-CM | POA: Diagnosis not present

## 2024-03-26 DIAGNOSIS — Z170 Estrogen receptor positive status [ER+]: Secondary | ICD-10-CM | POA: Insufficient documentation

## 2024-03-26 DIAGNOSIS — Z803 Family history of malignant neoplasm of breast: Secondary | ICD-10-CM | POA: Diagnosis not present

## 2024-03-26 DIAGNOSIS — Z1721 Progesterone receptor positive status: Secondary | ICD-10-CM | POA: Diagnosis not present

## 2024-03-26 DIAGNOSIS — G4730 Sleep apnea, unspecified: Secondary | ICD-10-CM | POA: Diagnosis not present

## 2024-03-26 DIAGNOSIS — C50211 Malignant neoplasm of upper-inner quadrant of right female breast: Secondary | ICD-10-CM | POA: Diagnosis present

## 2024-03-26 DIAGNOSIS — Z1732 Human epidermal growth factor receptor 2 negative status: Secondary | ICD-10-CM | POA: Diagnosis not present

## 2024-03-26 DIAGNOSIS — Z79890 Hormone replacement therapy (postmenopausal): Secondary | ICD-10-CM | POA: Diagnosis not present

## 2024-03-26 LAB — PREGNANCY, URINE: Preg Test, Ur: NEGATIVE

## 2024-03-26 NOTE — Progress Notes (Addendum)
 Location of Breast Cancer: Malignant Neoplasm of Upper-Outer Quadrant of Right Breast, Estrogen Receptor  Positive   Histology per Pathology Report:    Receptor Status: ER(90%), PR (10%), Her2-neu (Negative), Ki-67(10%)  Did patient present with symptoms (if so, please note symptoms) or was this found on screening mammography?:  Mammogram  Past/Anticipated interventions by surgeon, if any: Scheduled Lumpectomy April 06 2024   Past/Anticipated interventions by medical oncology, if any:  03/23/2024 Iruku, MD     Lymphedema issues, if any:  None    Pain issues, if any:   None  SAFETY ISSUES: Prior radiation? None Pacemaker/ICD? None Possible current pregnancy?None Is the patient on methotrexate? None  Current Complaints / other details:  None

## 2024-03-26 NOTE — Telephone Encounter (Signed)
 Was asked by Dr Izell to call patient and give treatment estimate.  The front of patients insurance card states her out of pocket max for her [facility] family plan is $6000. Left a voicemail for patient with this information.

## 2024-03-29 ENCOUNTER — Inpatient Hospital Stay: Admitting: Licensed Clinical Social Worker

## 2024-03-29 NOTE — Progress Notes (Signed)
 [facility] Clinical Social Work  Initial Assessment   Michelle Moss is a 54 y.o. year old female contacted by phone. Clinical Social Work was referred by statistician for emotional support.   SDOH (Social Determinants of Health) assessments performed: Yes SDOH Interventions    Flowsheet Row Clinical Support from 03/29/2024 in [facility] Med [facility] - A Dept Of [place]. [facility]  SDOH Interventions   Food Insecurity Interventions Intervention Not Indicated  Housing Interventions Intervention Not Indicated  Transportation Interventions Intervention Not Indicated  Utilities Interventions Intervention Not Indicated    SDOH Screenings   Food Insecurity: No Food Insecurity (03/29/2024)  Housing: Low Risk (03/29/2024)  Transportation Needs: No Transportation Needs (03/29/2024)  Utilities: Not At Risk (03/29/2024)  Depression (PHQ2-9): Low Risk (03/23/2024)  Tobacco Use: Low Risk  (03/19/2024)   Received from [facility]    PHQ 2/9:    03/23/2024    2:30 PM  Depression screen PHQ 2/9  Decreased Interest 0  Down, Depressed, Hopeless 0  PHQ - 2 Score 0     Distress Screen completed: No     No data to display            Family/Social Information:  Housing Arrangement: patient lives with her husband Family members/support persons in your life? Family- 13 siblings (10 sisters), son in [place] , daughter in the [place] Transportation concerns: no  Employment: Working full time . 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  Income source: Employment Financial concerns: No Type of concern: None Food access concerns: no Advanced directives: Not known Services Currently in place:  [facility]  Coping/ Adjustment to diagnosis: Patient understands treatment plan and what happens next? yes, she is preparing for surgery. She is aware that oncotype/chemo will be determined after surgery as well as length of radiation. Pt feels she is coping well and focusing on the positives  (caught early, treatable). She has a moment maybe once a day where she feels like crying, allows it to happen, and then is able to move forward Concerns about diagnosis and/or treatment: Unknown of timing in relation to planning work/trips/etc Patient reported stressors: Adjusting to my illness Current coping skills/ strengths: Ability for insight , Capable of independent living , Communication skills , Motivation for treatment/growth , and Supportive family/friends     SUMMARY: Current SDOH Barriers:  No major barriers identified today  Clinical Social Work Clinical Goal(s):  No clinical social work goals at this time  Interventions: Discussed common feeling and emotions when being diagnosed with cancer, and the importance of support during treatment Informed patient of the support team roles and support services at [facility] Provided CSW contact information and encouraged patient to call with any questions or concerns   Follow Up Plan: Patient will contact CSW with any support or resource needs Patient verbalizes understanding of plan: Yes    Dezeray Puccio E Kierra Jezewski, LCSW Clinical Social Worker [facility]

## 2024-03-30 NOTE — Pre-Procedure Instructions (Signed)
 Surgical Instructions   Your procedure is scheduled on April 06, 2024. Report to [facility] Main Entrance A at 10:20 A.M., then check in with the Admitting office. Any questions or running late day of surgery: call [phone]  Questions prior to your surgery date: call [phone], Monday-Friday, 8am-4pm. If you experience any cold or flu symptoms such as cough, fever, chills, shortness of breath, etc. between now and your scheduled surgery, please notify us  at the above number.     Remember:  Do not eat after midnight the night before your surgery   You may drink clear liquids until 09:20 the morning of your surgery.   Clear liquids allowed are: Water, Non-Citrus Juices (without pulp), Carbonated Beverages, Clear Tea (no milk, honey, etc.), Black Coffee Only (NO MILK, CREAM OR POWDERED CREAMER of any kind), and Gatorade.   Patient Instructions  The night before surgery:  No food after midnight. ONLY clear liquids after midnight  The day of surgery (if you do NOT have diabetes):  Drink ONE (1) Pre-Surgery Clear Ensure by 09:20 the morning of surgery. Drink in one sitting. Do not sip.  This drink was given to you during your hospital  pre-op appointment visit.  Nothing else to drink after completing the  Pre-Surgery Clear Ensure.          If you have questions, please contact your surgeons office.  Take these medicines the morning of surgery with A SIP OF WATER  FLUoxetine (PROZAC)    May take these medicines IF NEEDED: eletriptan (RELPAX)   DO NOT take metFORMIN (GLUCOPHAGE) the day of your surgery.  One week prior to surgery, STOP taking any Aspirin (unless otherwise instructed by your surgeon) Aleve, Naproxen, Ibuprofen, Motrin, Advil, Goody's, BC's, all herbal medications, fish oil, and non-prescription vitamins.                     Do NOT Smoke (Tobacco/Vaping) for 24 hours prior to your procedure.  If you use a CPAP at night, you may bring your  mask/headgear for your overnight stay.   You will be asked to remove any contacts, glasses, piercing's, hearing aid's, dentures/partials prior to surgery. Please bring cases for these items if needed.    Your surgeon will determine if you are to be admitted or discharged the same day.  Patients discharged the day of surgery will not be allowed to drive home, and someone needs to stay with them for 24 hours.  SURGICAL WAITING ROOM VISITATION Patients may have no more than 2 support people in the waiting area - these visitors may rotate.   Pre-op nurse will coordinate an appropriate time for 2 ADULT support persons, who may not rotate, to accompany patient in pre-op.  Children under the age of 90 must have an adult with them who is not the patient and must remain in the main waiting area with an adult.  If the patient needs to stay at the hospital during part of their recovery, the visitor guidelines for inpatient rooms apply.  Please refer to the [facility] website for the visitor guidelines for any additional information.   If you received a COVID test during your pre-op visit  it is requested that you wear a mask when out in public, stay away from anyone that may not be feeling well and notify your surgeon if you develop symptoms. If you have been in contact with anyone that has tested positive in the last 10 days please notify you  careers adviser.      Pre-operative CHG Bathing Instructions   You can play a key role in reducing the risk of infection after surgery. Your skin needs to be as free of germs as possible. You can reduce the number of germs on your skin by washing with CHG (chlorhexidine gluconate) soap before surgery. CHG is an antiseptic soap that kills germs and continues to kill germs even after washing.   DO NOT use if you have an allergy to chlorhexidine/CHG or antibacterial soaps. If your skin becomes reddened or irritated, stop using the CHG and notify one of our RNs at  [phone].              TAKE A SHOWER THE NIGHT BEFORE SURGERY   Please keep in mind the following:  DO NOT shave, including legs and underarms, 48 hours prior to surgery.   You may shave your face before/day of surgery.  Place clean sheets on your bed the night before surgery Use a clean washcloth (not used since being washed) for shower. DO NOT sleep with pet's night before surgery.  CHG Shower Instructions:  Wash your face and private area with normal soap. If you choose to wash your hair, wash first with your normal shampoo.  After you use shampoo/soap, rinse your hair and body thoroughly to remove shampoo/soap residue.  Turn the water OFF and apply half the bottle of CHG soap to a CLEAN washcloth.  Apply CHG soap ONLY FROM YOUR NECK DOWN TO YOUR TOES (washing for 3-5 minutes)  DO NOT use CHG soap on face, private areas, open wounds, or sores.  Pay special attention to the area where your surgery is being performed.  If you are having back surgery, having someone wash your back for you may be helpful. Wait 2 minutes after CHG soap is applied, then you may rinse off the CHG soap.  Pat dry with a clean towel  Put on clean pajamas    Additional instructions for the day of surgery: If you choose, you may shower the morning of surgery with an antibacterial soap.  DO NOT APPLY any lotions, deodorants, cologne, or perfumes.   Do not wear jewelry or makeup Do not wear nail polish, gel polish, artificial nails, or any other type of covering on natural nails (fingers and toes) Do not bring valuables to the hospital. [facility] is not responsible for valuables/personal belongings. Put on clean/comfortable clothes.  Please brush your teeth.  Ask your nurse before applying any prescription medications to the skin.

## 2024-03-31 ENCOUNTER — Other Ambulatory Visit: Payer: Self-pay

## 2024-03-31 ENCOUNTER — Encounter (HOSPITAL_COMMUNITY)
Admission: RE | Admit: 2024-03-31 | Discharge: 2024-03-31 | Disposition: A | Discharge status: Home / Self Care | Source: Ambulatory Visit | Attending: General Surgery | Admitting: General Surgery

## 2024-03-31 ENCOUNTER — Encounter (HOSPITAL_COMMUNITY): Payer: Self-pay | Admitting: *Deleted

## 2024-03-31 VITALS — BP 141/84 | HR 79 | Temp 97.7°F | Resp 17 | Ht 67.0 in | Wt 246.0 lb

## 2024-03-31 DIAGNOSIS — G4733 Obstructive sleep apnea (adult) (pediatric): Secondary | ICD-10-CM | POA: Diagnosis not present

## 2024-03-31 DIAGNOSIS — R9431 Abnormal electrocardiogram [ECG] [EKG]: Secondary | ICD-10-CM | POA: Insufficient documentation

## 2024-03-31 DIAGNOSIS — Z01818 Encounter for other preprocedural examination: Secondary | ICD-10-CM | POA: Diagnosis present

## 2024-03-31 DIAGNOSIS — C50211 Malignant neoplasm of upper-inner quadrant of right female breast: Secondary | ICD-10-CM | POA: Insufficient documentation

## 2024-03-31 DIAGNOSIS — Z170 Estrogen receptor positive status [ER+]: Secondary | ICD-10-CM | POA: Diagnosis not present

## 2024-03-31 DIAGNOSIS — E119 Type 2 diabetes mellitus without complications: Secondary | ICD-10-CM | POA: Diagnosis not present

## 2024-03-31 HISTORY — DX: Cardiac murmur, unspecified: R01.1

## 2024-03-31 HISTORY — DX: Type 2 diabetes mellitus without complications: E11.9

## 2024-03-31 LAB — BASIC METABOLIC PANEL WITH GFR
Anion gap: 8 (ref 5–15)
BUN: 14 mg/dL (ref 6–20)
CO2: 28 mmol/L (ref 22–32)
Calcium: 9.1 mg/dL (ref 8.9–10.3)
Chloride: 104 mmol/L (ref 98–111)
Creatinine, Ser: 0.9 mg/dL (ref 0.44–1.00)
GFR, Estimated: >60 mL/min
Glucose, Bld: 99 mg/dL (ref 70–99)
Potassium: 4.1 mmol/L (ref 3.5–5.1)
Sodium: 140 mmol/L (ref 135–145)

## 2024-03-31 LAB — CBC
HCT: 48 % — ABNORMAL HIGH (ref 36.0–46.0)
Hemoglobin: 15.6 g/dL — ABNORMAL HIGH (ref 12.0–15.0)
MCH: 28.7 pg (ref 26.0–34.0)
MCHC: 32.5 g/dL (ref 30.0–36.0)
MCV: 88.2 fL (ref 80.0–100.0)
Platelets: 312 K/uL (ref 150–400)
RBC: 5.44 MIL/uL — ABNORMAL HIGH (ref 3.87–5.11)
RDW: 13.4 % (ref 11.5–15.5)
WBC: 9.1 K/uL (ref 4.0–10.5)
nRBC: 0 % (ref 0.0–0.2)

## 2024-03-31 LAB — HEMOGLOBIN A1C
Hgb A1c MFr Bld: 6.2 % — ABNORMAL HIGH (ref 4.8–5.6)
Mean Plasma Glucose: 131.24 mg/dL

## 2024-03-31 LAB — GLUCOSE, CAPILLARY: Glucose-Capillary: 111 mg/dL — ABNORMAL HIGH (ref 70–99)

## 2024-03-31 NOTE — Progress Notes (Signed)
 PCP - Alm Remonia COME Cardiologist - denies  PPM/ICD - denies Device Orders -  Rep Notified -   Chest x-ray - na EKG - 03/31/24 Stress Test - denies ECHO - denies Cardiac Cath - denies  Sleep Study - +OSA CPAP - uses a mouthpiece  Fasting Blood Sugar - Pt does not check blood sugar at home. Checks Blood Sugar _____ times a day  Last dose of GLP1 agonist-  na GLP1 instructions:   Blood Thinner Instructions:na Aspirin Instructions:na  ERAS Protcol -clear liquids until 0920 PRE-SURGERY Ensure or G2- Ensure  COVID TEST- na   Anesthesia review: yes -pt sees her PCP where she works. He comes periodically and does exams. She says she was recently diagnosed with diabetes and started on Metformin.  She also said the doctor heard a murmur which she has had for a while, but she has not had an echocardiogram. She stated she could go to her office and have the notes and or any test results  from her medical exams faxed to PAT.  Patient denies shortness of breath, fever, cough and chest pain at PAT appointment   All instructions explained to the patient, with a verbal understanding of the material. Patient agrees to go over the instructions while at home for a better understanding. The opportunity to ask questions was provided.

## 2024-04-01 NOTE — Anesthesia Preprocedure Evaluation (Addendum)
"                                    Anesthesia Evaluation  Patient identified by MRN, date of birth, ID band Patient awake    Reviewed: Allergy & Precautions, NPO status , Patient's Chart, lab work & pertinent test results  Airway Mallampati: II  TM Distance: >3 FB Neck ROM: Full    Dental  (+) Teeth Intact, Dental Advisory Given   Pulmonary sleep apnea    Pulmonary exam normal breath sounds clear to auscultation       Cardiovascular negative cardio ROS Normal cardiovascular exam Rhythm:Regular Rate:Normal     Neuro/Psych negative neurological ROS  negative psych ROS   GI/Hepatic negative GI ROS, Neg liver ROS,,,  Endo/Other  diabetes, Well Controlled, Type 2, Oral Hypoglycemic Agents  Obesity BMI 39 A1c 6.2  Renal/GU negative Renal ROS  negative genitourinary   Musculoskeletal  (+) Arthritis , Osteoarthritis,    Abdominal  (+) + obese  Peds  Hematology negative hematology ROS (+) Hb 15.6   Anesthesia Other Findings   Reproductive/Obstetrics negative OB ROS                              Anesthesia Physical Anesthesia Plan  ASA: 3  Anesthesia Plan: General   Post-op Pain Management: Tylenol  PO (pre-op)* and Toradol  IV (intra-op)*   Induction: Intravenous  PONV Risk Score and Plan: 3 and Ondansetron , Dexamethasone , Midazolam  and Treatment may vary due to age or medical condition  Airway Management Planned: LMA  Additional Equipment: None  Intra-op Plan:   Post-operative Plan: Extubation in OR  Informed Consent: I have reviewed the patients History and Physical, chart, labs and discussed the procedure including the risks, benefits and alternatives for the proposed anesthesia with the patient or authorized representative who has indicated his/her understanding and acceptance.     Dental advisory given  Plan Discussed with: CRNA  Anesthesia Plan Comments: (  )         Anesthesia Servidio Evaluation  "

## 2024-04-01 NOTE — Progress Notes (Signed)
 Anesthesia Chart Review:   Case: [id number] Date/Time: 04/06/24 1206   Procedure: LUMPECTOMY WITH MAGNETIC MARKER LOCALIZATION (Right) - RIGHT BREAST MAG SEED GUIDED LUMPECTOMY   Anesthesia type: General   Diagnosis: Malignant neoplasm of upper-inner quadrant of right breast in female, estrogen receptor positive (HCC) [C50.211, Z17.0]   Pre-op diagnosis: RIGHT BREAST CANCER   [facility] ROOM 10 / [facility] OR   Surgeons: Ebbie Cough, MD       DISCUSSION: Patient is a 54 year old female scheduled for the above procedure.  History includes never smoker, DM2, murmur, anemia, breast cancer (right breast biopsy: IDC 02/27/2024), OSA (uses mouth piece).  Anesthesia APP forwarded chart for review after her PAT visit. We did receive a Progress Note from Remonia Alm PARAS, MD from 10/29/2021 [facility] periodic exam). She was noted to have a II/VI SEM then. No CV symptoms. No LE edema. At that time, he did not advise echocardiogram unless worsening murmur or symptoms develop. No murmur documented at her 03/26/2024 RAD-ONC visit with Dr. Izell. She denied chest pain and SOB.   A1c 6.2%. She reported recent diagnosis of DM2 and was started on metformin.   Mag Seed was scheduled to be placed on 03/29/2024. Anesthesia team to evaluate on the day of surgery.      VS: BP (!) 141/84   Pulse 79   Temp 36.5 C   Resp 17   Ht 5' 7 (1.702 m)   Wt 111.6 kg   LMP 10/29/2019   SpO2 96%   BMI 38.53 kg/m   PROVIDERS: Remonia Alm PARAS, MD is listed as PCP (through employer  [employer], regional health physician) Loretha Ash, MD is HEM-ONC Izell Domino, MD is RAD-ONC  LABS: Labs reviewed: Acceptable for surgery. (all labs ordered are listed, but only abnormal results are displayed)  Labs Reviewed  GLUCOSE, CAPILLARY - Abnormal; Notable for the following components:      Result Value   Glucose-Capillary 111 (*)    All other components within normal limits  CBC - Abnormal; Notable for the  following components:   RBC 5.44 (*)    Hemoglobin 15.6 (*)    HCT 48.0 (*)    All other components within normal limits  HEMOGLOBIN A1C - Abnormal; Notable for the following components:   Hgb A1c MFr Bld 6.2 (*)    All other components within normal limits  BASIC METABOLIC PANEL WITH GFR    EKG:  EKG 03/31/2024: Normal sinus rhythm Minimal voltage criteria for LVH, may be normal variant ( R in aVL ) Inferior infarct , age undetermined Anterior infarct , age undetermined Abnormal ECG Confirmed by Darliss Rogue ([id number]) on 03/31/2024 4:26:00 PM  - A comparison EKG from her PCP is scanned under the [facility] tab. EKG is from 11/11/2022 and showed SR, possible inferior infarct, age undetermined. Inferior leads appear similar. Poor r wave progression in V3-4 is new, could consider lead placement.    CV: N/A  Past Medical History:  Diagnosis Date   Anemia    Breast cancer (HCC)    Diabetes mellitus without complication (HCC)    Fibrocystic breast    Heart murmur    Sleep apnea    in past-no issues    Past Surgical History:  Procedure Laterality Date   BREAST LUMPECTOMY Left 2011   BTL  2001   KNEE SURGERY Right 2024   per pt-partial knee replacement   ROTATOR CUFF REPAIR Right 2011   TONSILECTOMY/ADENOIDECTOMY WITH MYRINGOTOMY  1995  TUBAL LIGATION     WRIST SURGERY Right     MEDICATIONS:  eletriptan (RELPAX) 40 MG tablet   FLUoxetine (PROZAC) 10 MG tablet   metFORMIN (GLUCOPHAGE) 500 MG tablet   Omega-3 Fatty Acids (FISH OIL) 1000 MG CAPS   No current facility-administered medications for this encounter.    Isaiah Ruder, PA-C Surgical Short Stay/Anesthesiology [facility] Phone [phone] [facility] Phone [phone] 04/01/2024 5:21 PM

## 2024-04-06 ENCOUNTER — Other Ambulatory Visit: Payer: Self-pay

## 2024-04-06 ENCOUNTER — Ambulatory Visit (HOSPITAL_COMMUNITY): Payer: Self-pay | Admitting: Vascular Surgery

## 2024-04-06 ENCOUNTER — Encounter (HOSPITAL_COMMUNITY): Payer: Self-pay | Admitting: General Surgery

## 2024-04-06 ENCOUNTER — Encounter (HOSPITAL_COMMUNITY)
Admission: RE | Disposition: A | Discharge status: Home / Self Care | Payer: Self-pay | Source: Home / Self Care | Attending: General Surgery

## 2024-04-06 ENCOUNTER — Ambulatory Visit (HOSPITAL_BASED_OUTPATIENT_CLINIC_OR_DEPARTMENT_OTHER): Admitting: Registered Nurse

## 2024-04-06 ENCOUNTER — Ambulatory Visit (HOSPITAL_COMMUNITY)
Admission: RE | Admit: 2024-04-06 | Discharge: 2024-04-06 | Disposition: A | Discharge status: Home / Self Care | Attending: General Surgery | Admitting: General Surgery

## 2024-04-06 DIAGNOSIS — C50911 Malignant neoplasm of unspecified site of right female breast: Secondary | ICD-10-CM

## 2024-04-06 DIAGNOSIS — Z1721 Progesterone receptor positive status: Secondary | ICD-10-CM | POA: Diagnosis not present

## 2024-04-06 DIAGNOSIS — M1990 Unspecified osteoarthritis, unspecified site: Secondary | ICD-10-CM | POA: Insufficient documentation

## 2024-04-06 DIAGNOSIS — E119 Type 2 diabetes mellitus without complications: Secondary | ICD-10-CM | POA: Insufficient documentation

## 2024-04-06 DIAGNOSIS — C50211 Malignant neoplasm of upper-inner quadrant of right female breast: Secondary | ICD-10-CM | POA: Insufficient documentation

## 2024-04-06 DIAGNOSIS — Z1732 Human epidermal growth factor receptor 2 negative status: Secondary | ICD-10-CM | POA: Insufficient documentation

## 2024-04-06 DIAGNOSIS — E669 Obesity, unspecified: Secondary | ICD-10-CM | POA: Insufficient documentation

## 2024-04-06 DIAGNOSIS — Z170 Estrogen receptor positive status [ER+]: Secondary | ICD-10-CM | POA: Diagnosis present

## 2024-04-06 DIAGNOSIS — G4730 Sleep apnea, unspecified: Secondary | ICD-10-CM | POA: Insufficient documentation

## 2024-04-06 DIAGNOSIS — Z7984 Long term (current) use of oral hypoglycemic drugs: Secondary | ICD-10-CM | POA: Diagnosis not present

## 2024-04-06 DIAGNOSIS — Z6839 Body mass index (BMI) 39.0-39.9, adult: Secondary | ICD-10-CM | POA: Insufficient documentation

## 2024-04-06 LAB — GLUCOSE, CAPILLARY
Glucose-Capillary: 116 mg/dL — ABNORMAL HIGH (ref 70–99)
Glucose-Capillary: 87 mg/dL (ref 70–99)

## 2024-04-06 MED ORDER — KETOROLAC TROMETHAMINE 30 MG/ML IJ SOLN
INTRAMUSCULAR | Status: DC | PRN
  Administered 2024-04-06: 30 mg via INTRAVENOUS

## 2024-04-06 MED ORDER — OXYCODONE HCL 5 MG/5ML PO SOLN: 5.0000 mg | Freq: Once | ORAL | Status: AC | PRN

## 2024-04-06 MED ORDER — ACETAMINOPHEN 500 MG PO TABS
1000.0000 mg | ORAL_TABLET | ORAL | Status: AC
  Administered 2024-04-06: 1000 mg via ORAL
  Filled 2024-04-06: qty 2

## 2024-04-06 MED ORDER — ROCURONIUM BROMIDE 10 MG/ML (PF) SYRINGE
PREFILLED_SYRINGE | INTRAVENOUS | Status: AC
  Filled 2024-04-06: qty 10

## 2024-04-06 MED ORDER — INSULIN ASPART 100 UNIT/ML IJ SOLN: 0.0000 [IU] | INTRAMUSCULAR | Status: DC | PRN

## 2024-04-06 MED ORDER — KETOROLAC TROMETHAMINE 30 MG/ML IJ SOLN: 30.0000 mg | Freq: Once | INTRAMUSCULAR | Status: DC | PRN

## 2024-04-06 MED ORDER — PROPOFOL 10 MG/ML IV BOLUS
INTRAVENOUS | Status: DC | PRN
  Administered 2024-04-06: 20 mg via INTRAVENOUS
  Administered 2024-04-06: 200 mg via INTRAVENOUS

## 2024-04-06 MED ORDER — BUPIVACAINE-EPINEPHRINE 0.25% -1:200000 IJ SOLN
INTRAMUSCULAR | Status: DC | PRN
  Administered 2024-04-06: 20 mL

## 2024-04-06 MED ORDER — GLYCOPYRROLATE PF 0.2 MG/ML IJ SOSY
PREFILLED_SYRINGE | INTRAMUSCULAR | Status: DC | PRN
  Administered 2024-04-06: .1 mg via INTRAVENOUS

## 2024-04-06 MED ORDER — ONDANSETRON HCL 4 MG/2ML IJ SOLN
INTRAMUSCULAR | Status: DC | PRN
  Administered 2024-04-06: 4 mg via INTRAVENOUS

## 2024-04-06 MED ORDER — AMISULPRIDE (ANTIEMETIC) 5 MG/2ML IV SOLN: 10.0000 mg | Freq: Once | INTRAVENOUS | Status: DC | PRN

## 2024-04-06 MED ORDER — LIDOCAINE 2% (20 MG/ML) 5 ML SYRINGE
INTRAMUSCULAR | Status: DC | PRN
  Administered 2024-04-06: 60 mg via INTRAVENOUS

## 2024-04-06 MED ORDER — KETOROLAC TROMETHAMINE 30 MG/ML IJ SOLN
INTRAMUSCULAR | Status: AC
  Filled 2024-04-06: qty 1

## 2024-04-06 MED ORDER — LIDOCAINE 2% (20 MG/ML) 5 ML SYRINGE
INTRAMUSCULAR | Status: AC
  Filled 2024-04-06: qty 5

## 2024-04-06 MED ORDER — DEXAMETHASONE SOD PHOSPHATE PF 10 MG/ML IJ SOLN
INTRAMUSCULAR | Status: AC
  Filled 2024-04-06: qty 1

## 2024-04-06 MED ORDER — LACTATED RINGERS IV SOLN: INTRAVENOUS | Status: DC

## 2024-04-06 MED ORDER — ONDANSETRON HCL 4 MG/2ML IJ SOLN
INTRAMUSCULAR | Status: AC
  Filled 2024-04-06: qty 2

## 2024-04-06 MED ORDER — ACETAMINOPHEN 500 MG PO TABS: 1000.0000 mg | ORAL_TABLET | Freq: Once | ORAL | Status: DC

## 2024-04-06 MED ORDER — DEXAMETHASONE SOD PHOSPHATE PF 10 MG/ML IJ SOLN
INTRAMUSCULAR | Status: DC | PRN
  Administered 2024-04-06: 5 mg via INTRAVENOUS

## 2024-04-06 MED ORDER — ORAL CARE MOUTH RINSE: 15.0000 mL | Freq: Once | OROMUCOSAL | Status: AC

## 2024-04-06 MED ORDER — MIDAZOLAM HCL 2 MG/2ML IJ SOLN
INTRAMUSCULAR | Status: AC
  Filled 2024-04-06: qty 2

## 2024-04-06 MED ORDER — FENTANYL CITRATE (PF) 100 MCG/2ML IJ SOLN
INTRAMUSCULAR | Status: AC
  Filled 2024-04-06: qty 2

## 2024-04-06 MED ORDER — 0.9 % SODIUM CHLORIDE (POUR BTL) OPTIME
TOPICAL | Status: DC | PRN
  Administered 2024-04-06: 1000 mL

## 2024-04-06 MED ORDER — CEFAZOLIN SODIUM-DEXTROSE 2-4 GM/100ML-% IV SOLN
2.0000 g | INTRAVENOUS | Status: AC
  Administered 2024-04-06: 2 g via INTRAVENOUS
  Filled 2024-04-06: qty 100

## 2024-04-06 MED ORDER — EPHEDRINE SULFATE-NACL 50-0.9 MG/10ML-% IV SOSY
PREFILLED_SYRINGE | INTRAVENOUS | Status: DC | PRN
  Administered 2024-04-06 (×2): 10 mg via INTRAVENOUS
  Administered 2024-04-06: 5 mg via INTRAVENOUS

## 2024-04-06 MED ORDER — BUPIVACAINE-EPINEPHRINE (PF) 0.25% -1:200000 IJ SOLN
INTRAMUSCULAR | Status: AC
  Filled 2024-04-06: qty 30

## 2024-04-06 MED ORDER — OXYCODONE HCL 5 MG PO TABS
ORAL_TABLET | ORAL | Status: AC
  Filled 2024-04-06: qty 1

## 2024-04-06 MED ORDER — OXYCODONE HCL 5 MG PO TABS: 5.0000 mg | ORAL_TABLET | ORAL | Status: DC | PRN

## 2024-04-06 MED ORDER — OXYCODONE HCL 5 MG PO TABS
5.0000 mg | ORAL_TABLET | Freq: Once | ORAL | Status: AC | PRN
  Administered 2024-04-06: 5 mg via ORAL

## 2024-04-06 MED ORDER — CHLORHEXIDINE GLUCONATE CLOTH 2 % EX PADS: 6.0000 | MEDICATED_PAD | Freq: Once | CUTANEOUS | Status: DC

## 2024-04-06 MED ORDER — CHLORHEXIDINE GLUCONATE 0.12 % MT SOLN: 15.0000 mL | Freq: Once | OROMUCOSAL | Status: DC

## 2024-04-06 MED ORDER — MIDAZOLAM HCL (PF) 2 MG/2ML IJ SOLN
INTRAMUSCULAR | Status: DC | PRN
  Administered 2024-04-06: 2 mg via INTRAVENOUS

## 2024-04-06 MED ORDER — CHLORHEXIDINE GLUCONATE 0.12 % MT SOLN
15.0000 mL | Freq: Once | OROMUCOSAL | Status: AC
  Administered 2024-04-06: 15 mL via OROMUCOSAL
  Filled 2024-04-06: qty 15

## 2024-04-06 MED ORDER — MEPERIDINE HCL 25 MG/ML IJ SOLN: 6.2500 mg | INTRAMUSCULAR | Status: DC | PRN

## 2024-04-06 MED ORDER — ENSURE PRE-SURGERY PO LIQD: 296.0000 mL | Freq: Once | ORAL | Status: DC

## 2024-04-06 MED ORDER — ONDANSETRON HCL 4 MG/2ML IJ SOLN: 4.0000 mg | Freq: Once | INTRAMUSCULAR | Status: DC | PRN

## 2024-04-06 MED ORDER — PHENYLEPHRINE 80 MCG/ML (10ML) SYRINGE FOR IV PUSH (FOR BLOOD PRESSURE SUPPORT)
PREFILLED_SYRINGE | INTRAVENOUS | Status: AC
  Filled 2024-04-06: qty 10

## 2024-04-06 MED ORDER — PHENYLEPHRINE 80 MCG/ML (10ML) SYRINGE FOR IV PUSH (FOR BLOOD PRESSURE SUPPORT)
PREFILLED_SYRINGE | INTRAVENOUS | Status: DC | PRN
  Administered 2024-04-06 (×2): 160 ug via INTRAVENOUS
  Administered 2024-04-06 (×2): 120 ug via INTRAVENOUS
  Administered 2024-04-06: 80 ug via INTRAVENOUS

## 2024-04-06 MED ORDER — ORAL CARE MOUTH RINSE: 15.0000 mL | Freq: Once | OROMUCOSAL | Status: DC

## 2024-04-06 MED ORDER — HYDROMORPHONE HCL 1 MG/ML IJ SOLN: 0.2500 mg | INTRAMUSCULAR | Status: DC | PRN

## 2024-04-06 MED ORDER — FENTANYL CITRATE (PF) 250 MCG/5ML IJ SOLN
INTRAMUSCULAR | Status: DC | PRN
  Administered 2024-04-06 (×2): 50 ug via INTRAVENOUS

## 2024-04-06 NOTE — Anesthesia Procedure Notes (Signed)
 Procedure Name: LMA Insertion Date/Time: 04/06/2024 11:18 AM  Performed by: Christopher Comings, CRNAPre-anesthesia Checklist: Patient identified, Emergency Drugs available, Suction available and Patient being monitored Patient Re-evaluated:Patient Re-evaluated prior to induction Oxygen Delivery Method: Circle system utilized Preoxygenation: Pre-oxygenation with 100% oxygen Induction Type: IV induction Ventilation: Mask ventilation without difficulty LMA: LMA inserted LMA Size: 4.0 Number of attempts: 1 Placement Confirmation: positive ETCO2 and breath sounds checked- equal and bilateral Tube secured with: Tape Dental Injury: Teeth and Oropharynx as per pre-operative assessment

## 2024-04-06 NOTE — Transfer of Care (Signed)
 Immediate Anesthesia Transfer of Care Note  Patient: Michelle Moss  Procedure(s) Performed: LUMPECTOMY WITH MAGNETIC MARKER LOCALIZATION (Right: Breast)  Patient Location: PACU  Anesthesia Type:General  Level of Consciousness: drowsy and patient cooperative  Airway & Oxygen Therapy: Patient connected to face mask oxygen  Post-op Assessment: Report given to RN and Post -op Vital signs reviewed and stable  Post vital signs: Reviewed and stable  Last Vitals:  Vitals Value Taken Time  BP 129/76 04/06/24 12:18  Temp    Pulse 75 04/06/24 12:19  Resp 16 04/06/24 12:19  SpO2 98 % 04/06/24 12:19  Vitals shown include unfiled device data.  Last Pain:  Vitals:   04/06/24 1052  TempSrc:   PainSc: 0-No pain         Complications: There were no known notable events for this encounter.

## 2024-04-06 NOTE — Op Note (Signed)
 Preoperative diagnosis: clinical stage I right breast cancer Postoperative diagnosis: Same as above Procedure: Right breast seed guided lumpectomy Surgeon: Dr. Adina Bury Anesthesia: General Estimated blood loss: Minimal Specimens:  Right breast lumpectomy marked with paint Additional anterior and medial margins marked short superior, long lateral and double deep Complications: None Sponge needle count was correct completion Disposition recovery stable addition   Indications: 54 year old female I saw in 2023 for high risk. She was calculated to have a TC lifetime risk of 24%. We discussed following her with staggered mammograms and MRI. She had an MRI in December 2024 that was negative. Her next imaging was then a mammogram that she had in November 2025. This showed a 7 mm mass in the right breast. Ultrasound showed no abnormal lymph nodes and a 6 mm mass in the right breast at 3:00 posterior depth. This underwent a biopsy. The biopsy shows a grade 2 invasive ductal carcinoma that is 90% ER positive, 10% PR positive, HER2 negative and the Ki-67 is 10%. We discussed a lumpectomy alone.    Procedure: After informed consent was obtained she was taken to the operating room.  She had a seed placed prior to beginning.  I had these mammograms in the operating room.  She was given antibiotics.  SCDs were placed.  She was placed under general anesthesia without complication.  She was prepped and draped in a standard sterile surgical fashion.  A surgical timeout was then performed.   I located the seed in the right upper inner quadrant.  I infiltrated Marcaine  throughout this area.  I then made a periareolar incision in order at the scar later.  I dissected to the seed using the probe for guidance.  I then removed the seed and some of the surrounding tissue.  Mammogram confirmed removal of the seed and the clip.  I then obtained hemostasis. I thought I was close to a couple margins and removed these.   I  did place a couple clips in the cavity.  I closed the breast tissue with 2-0 Vicryl.  The skin was closed with 3-0 Vicryl and 5-0 Monocryl.  Glue and Steri-Strips were applied.  She tolerated this well was extubated transferred recovery stable.

## 2024-04-06 NOTE — Anesthesia Postprocedure Evaluation (Signed)
"   Anesthesia Post Note  Patient: Michelle Moss  Procedure(s) Performed: LUMPECTOMY WITH MAGNETIC MARKER LOCALIZATION (Right: Breast)     Patient location during evaluation: PACU Anesthesia Type: General Level of consciousness: awake and alert, oriented and patient cooperative Pain management: pain level controlled Vital Signs Assessment: post-procedure vital signs reviewed and stable Respiratory status: spontaneous breathing, nonlabored ventilation and respiratory function stable Cardiovascular status: blood pressure returned to baseline and stable Postop Assessment: no apparent nausea or vomiting Anesthetic complications: no   There were no known notable events for this encounter.  Last Vitals:  Vitals:   04/06/24 1245 04/06/24 1259  BP: 136/76 (!) 144/75  Pulse: 65 67  Resp: 16 15  Temp:  (!) 36.4 C  SpO2: 94% 94%    Last Pain:  Vitals:   04/06/24 1247  TempSrc:   PainSc: 5                  Almarie HERO Charmine Bockrath      "

## 2024-04-06 NOTE — Interval H&P Note (Signed)
 History and Physical Interval Note:  04/06/2024 10:35 AM  Michelle Moss  has presented today for surgery, with the diagnosis of RIGHT BREAST CANCER.  The various methods of treatment have been discussed with the patient and family. After consideration of risks, benefits and other options for treatment, the patient has consented to  Procedures with comments: LUMPECTOMY WITH MAGNETIC MARKER LOCALIZATION (Right) - RIGHT BREAST MAG SEED GUIDED LUMPECTOMY as a surgical intervention.  The patient's history has been reviewed, patient examined, no change in status, stable for surgery.  I have reviewed the patient's chart and labs.  Questions were answered to the patient's satisfaction.     Donnice Bury

## 2024-04-06 NOTE — Discharge Instructions (Signed)
 [facility] Office Phone Number [phone]  POST OP INSTRUCTIONS Take 400 mg of ibuprofen every 8 hours or 650 mg tylenol  every 6 hours for next 72 hours then as needed. Use ice several times daily also.  A prescription for pain medication may be given to you upon discharge.  Take your pain medication as prescribed, if needed.  If narcotic pain medicine is not needed, then you may take acetaminophen  (Tylenol ), naprosyn (Alleve) or ibuprofen (Advil) as needed. Take your usually prescribed medications unless otherwise directed If you need a refill on your pain medication, please contact your pharmacy.  They will contact our office to request authorization.  Prescriptions will not be filled after 5pm or on week-ends. You should eat very light the first 24 hours after surgery, such as soup, crackers, pudding, etc.  Resume your normal diet the day after surgery. Most patients will experience some swelling and bruising in the breast.  Ice packs and a good support bra will help.  Wear the breast binder provided or a sports bra for 72 hours day and night.  After that wear a sports bra during the day until you return to the office. Swelling and bruising can take several days to resolve.  It is common to experience some constipation if taking pain medication after surgery.  Increasing fluid intake and taking a stool softener will usually help or prevent this problem from occurring.  A mild laxative (Milk of Magnesia or Miralax) should be taken according to package directions if there are no bowel movements after 48 hours. I used skin glue on the incision, you may shower in 24 hours.  The glue will flake off over the next 2-3 weeks as will the steristrips.   Any sutures or staples will be removed at the office during your follow-up visit. ACTIVITIES:  You may resume regular daily activities (gradually increasing) beginning the next day.  Wearing a good support bra or sports bra minimizes pain and  swelling.  You may have sexual intercourse when it is comfortable. You may drive when you no longer are taking prescription pain medication, you can comfortably wear a seatbelt, and you can safely maneuver your car and apply brakes. RETURN TO WORK:  ______________________________________________________________________________________ Michelle Moss should see your doctor in the office for a follow-up appointment approximately two to three weeks after your surgery.  Your doctor's nurse will typically make your follow-up appointment when she calls you with your pathology report.  Expect your pathology report 3-4 business days after your surgery.  You may call to check if you do not hear from us  after three days.  WHEN TO CALL DR Michelle Moss: Fever over 101.0 Nausea and/or vomiting. Extreme swelling or bruising. Continued bleeding from incision. Increased pain, redness, or drainage from the incision.  The clinic staff is available to answer your questions during regular business hours.  Please don't hesitate to call and ask to speak to one of the nurses for clinical concerns.  If you have a medical emergency, go to the nearest emergency room or call 911.  A surgeon from [facility] is always on call at the hospital.  For further questions, please visit [email or web address]

## 2024-04-06 NOTE — H&P (Signed)
 " 54 year old female I saw in 2023 for high risk. She was calculated to have a TC lifetime risk of 24%. We discussed following her with staggered mammograms and MRI. She had an MRI in December 2024 that was negative. Her next imaging was then a mammogram that she had in November 2025. This showed a 7 mm mass in the right breast. Ultrasound showed no abnormal lymph nodes and a 6 mm mass in the right breast at 3:00 posterior depth. She has no mass or discharge that she notes at all. This underwent a biopsy. The biopsy shows a grade 2 invasive ductal carcinoma that is 90% ER positive, 10% PR positive, HER2 negative and the Ki-67 is 10%. She is here to  Review of Systems: A complete review of systems was obtained from the patient. I have reviewed this information and discussed as appropriate with the patient. See HPI as well for other ROS.  Review of Systems  All other systems reviewed and are negative.  Medical History: Past Medical History:  Diagnosis Date  Sleep apnea   Patient Active Problem List  Diagnosis  Malignant neoplasm of upper-inner quadrant of right breast in female, estrogen receptor positive (CMS/HHS-HCC)   Past Surgical History:  Procedure Laterality Date  Left shoulder surgery  right hand surgery  Right shoulder surgery  TONSILLECTOMY  tubal ligation   No Known Allergies  Current Outpatient Medications on File Prior to Visit  Medication Sig Dispense Refill  cholecalciferol (VITAMIN D3) 1,250 mcg (50,000 unit) capsule TAKE 1 CAPSULE WITH FOOD EVERY 3 DAYS FOR 8 CAPSULES.  eletriptan (RELPAX) 40 MG tablet TAKE 1 TABLET BY MOUTH AT ONSET OF HEADACHE, MAY REPEAT DOSE ONCE IN 2 HOURS X 1 IN 24 HOURS  FLUoxetine (PROZAC) 10 MG tablet Take 10 mg by mouth once daily  meloxicam (MOBIC) 15 MG tablet take 1 tablet orally daily as needed  omega-3 fatty acids-fish oil 300-1,000 mg capsule Take by mouth  ascorbic acid, vitamin C, (VITAMIN C) 500 MG tablet Take by mouth   ergocalciferol , vitamin D2, 1,250 mcg (50,000 unit) capsule Take by mouth  multivitamin tablet Take 1 tablet by mouth once daily  zinc sulfate 50 mg zinc (220 mg) Tab Take by mouth   Family History  Problem Relation Age of Onset  Stroke Mother  High blood pressure (Hypertension) Father  Hyperlipidemia (Elevated cholesterol) Father  Coronary Artery Disease (Blocked arteries around heart) Father  Diabetes Father  Colon cancer Father  Diabetes Sister  Breast cancer Sister   Social History   Tobacco Use  Smoking Status Never  Smokeless Tobacco Never  Marital status: Married  Tobacco Use  Smoking status: Never  Smokeless tobacco: Never  Substance and Sexual Activity  Alcohol use: Yes  Comment: socially  Drug use: Never    Objective:   Physical Exam Vitals reviewed.  Constitutional:  Appearance: Normal appearance.  Chest:  Breasts: Right: No inverted nipple, mass or nipple discharge.  Left: No inverted nipple, mass or nipple discharge.  Lymphadenopathy:  Upper Body:  Right upper body: No supraclavicular or axillary adenopathy.  Left upper body: No supraclavicular or axillary adenopathy.  Neurological:  Mental Status: She is alert.    Assessment and Plan:   Malignant neoplasm of upper-inner quadrant of right breast in female, estrogen receptor positive (CMS/HHS-HCC)  Right breast radioactive seed guided lumpectomy  We discussed the staging and pathophysiology of breast cancer. We discussed all of the different options for treatment for breast cancer including surgery, chemotherapy, radiation therapy,  and antiestrogen therapy  We discussed a sentinel lymph node biopsy as she does not appear to having lymph node involvement right now. We discussed the performance of that with injection of tracer. We discussed that there is a chance of having a positive node with a sentinel lymph node biopsy and we will await the permanent pathology to make any other first further  decisions in terms of her treatment. We discussed up to a 5% risk lifetime of chronic shoulder pain as well as lymphedema associated with a sentinel lymph node biopsy. We discussed INSEMA and SOUND data in relation to tumor and age. She is in early 44s but I think if med onc and rad onc agree with this tumor we can omit a node biopsy. She would like to do that.   We discussed the options for treatment of the breast cancer which included lumpectomy versus a mastectomy. We discussed the performance of the lumpectomy with radioactive seed placement. We discussed a 5-10% chance of a positive margin requiring reexcision in the operating room. We also discussed that she will likely need radiation therapy if she undergoes lumpectomy. We discussed mastectomy and the postoperative care for that as well. Mastectomy can be followed by reconstruction. The decision for lumpectomy vs mastectomy has no impact on decision for chemotherapy. Most mastectomy patients will not need radiation therapy. We discussed that there is no difference in her survival whether she undergoes lumpectomy with radiation therapy or antiestrogen therapy versus a mastectomy. There is also no real difference between her recurrence in the breast.  We discussed the risks of operation including bleeding, infection, possible reoperation. She understands her further therapy will be based on what her stages at the time of her operation.  "

## 2024-04-06 NOTE — Interval H&P Note (Signed)
 History and Physical Interval Note:  04/06/2024 10:46 AM  Michelle Moss  has presented today for surgery, with the diagnosis of RIGHT BREAST CANCER.  The various methods of treatment have been discussed with the patient and family. After consideration of risks, benefits and other options for treatment, the patient has consented to  Procedures with comments: LUMPECTOMY WITH MAGNETIC MARKER LOCALIZATION (Right) - RIGHT BREAST MAG SEED GUIDED LUMPECTOMY as a surgical intervention.  The patient's history has been reviewed, patient examined, no change in status, stable for surgery.  I have reviewed the patient's chart and labs.  Questions were answered to the patient's satisfaction.     Donnice Bury

## 2024-04-09 LAB — SURGICAL PATHOLOGY

## 2024-04-13 ENCOUNTER — Telehealth: Payer: Self-pay | Admitting: *Deleted

## 2024-04-13 ENCOUNTER — Encounter: Payer: Self-pay | Admitting: *Deleted

## 2024-04-13 NOTE — Telephone Encounter (Signed)
 Ordered oncotype per Dr. Loretha. Requisition sent to [facility].

## 2024-04-21 ENCOUNTER — Encounter (HOSPITAL_COMMUNITY): Payer: Self-pay

## 2024-04-21 ENCOUNTER — Encounter: Payer: Self-pay | Admitting: *Deleted

## 2024-04-21 DIAGNOSIS — C50411 Malignant neoplasm of upper-outer quadrant of right female breast: Secondary | ICD-10-CM

## 2024-04-21 NOTE — Progress Notes (Signed)
 In basket sent to [facility]: Received oncotype results of 13/5% Referral placed to Dr. Izell Appt with Dr. Loretha on 2/10

## 2024-04-23 NOTE — Progress Notes (Incomplete)
 Location of Breast Cancer:{:21944} ***  Histology per Pathology Report: ***  Receptor Status: ER(90% Positive), PR (10% Positive), Her2-neu (Negative), Ki-67(10%)  Did patient present with symptoms (if so, please note symptoms) or was this found on screening mammography?: ***  Past/Anticipated interventions by surgeon, if any: 04/06/2024 Ebbie, MD  Lumpectomy with Magnetic Marker Localization  Right breast Mag seed Guided Lumpectomy  Past/Anticipated interventions by medical oncology, if any: Chemotherapy ***  Lymphedema issues, if any:  {:18581} {t:21944}   Pain issues, if any:  {:18581} {PAIN DESCRIPTION:21022940}  SAFETY ISSUES: Prior radiation? {:18581} Pacemaker/ICD? {:18581} Possible current pregnancy?{:18581} Is the patient on methotrexate? {:18581}  Current Complaints / other details:  ***

## 2024-04-27 ENCOUNTER — Inpatient Hospital Stay: Admitting: Hematology and Oncology

## 2024-04-28 ENCOUNTER — Ambulatory Visit

## 2024-04-28 ENCOUNTER — Ambulatory Visit: Admitting: Radiation Oncology

## 2024-04-30 ENCOUNTER — Ambulatory Visit: Attending: Hematology and Oncology | Admitting: Radiation Oncology
